# Patient Record
Sex: Female | Born: 1945
Health system: Southern US, Community
[De-identification: ages and names within clinical notes are randomized; demographics above are authoritative.]

## PROBLEM LIST (undated history)

## (undated) DIAGNOSIS — K219 Gastro-esophageal reflux disease without esophagitis: Secondary | ICD-10-CM

## (undated) DIAGNOSIS — T4145XA Adverse effect of unspecified anesthetic, initial encounter: Secondary | ICD-10-CM

## (undated) DIAGNOSIS — G4700 Insomnia, unspecified: Secondary | ICD-10-CM

## (undated) DIAGNOSIS — Z8719 Personal history of other diseases of the digestive system: Secondary | ICD-10-CM

## (undated) DIAGNOSIS — H35372 Puckering of macula, left eye: Secondary | ICD-10-CM

## (undated) DIAGNOSIS — T8859XA Other complications of anesthesia, initial encounter: Secondary | ICD-10-CM

## (undated) DIAGNOSIS — Z87442 Personal history of urinary calculi: Secondary | ICD-10-CM

## (undated) DIAGNOSIS — B029 Zoster without complications: Secondary | ICD-10-CM

## (undated) DIAGNOSIS — G43909 Migraine, unspecified, not intractable, without status migrainosus: Secondary | ICD-10-CM

## (undated) DIAGNOSIS — J309 Allergic rhinitis, unspecified: Secondary | ICD-10-CM

## (undated) DIAGNOSIS — E785 Hyperlipidemia, unspecified: Secondary | ICD-10-CM

## (undated) DIAGNOSIS — J189 Pneumonia, unspecified organism: Secondary | ICD-10-CM

## (undated) DIAGNOSIS — J45909 Unspecified asthma, uncomplicated: Secondary | ICD-10-CM

## (undated) DIAGNOSIS — F4321 Adjustment disorder with depressed mood: Secondary | ICD-10-CM

## (undated) DIAGNOSIS — D6851 Activated protein C resistance: Secondary | ICD-10-CM

## (undated) DIAGNOSIS — E039 Hypothyroidism, unspecified: Secondary | ICD-10-CM

## (undated) DIAGNOSIS — H35342 Macular cyst, hole, or pseudohole, left eye: Secondary | ICD-10-CM

## (undated) HISTORY — PX: CYST EXCISION: SHX5701

## (undated) HISTORY — PX: CATARACT EXTRACTION W/ INTRAOCULAR LENS  IMPLANT, BILATERAL: SHX1307

## (undated) HISTORY — PX: MEDIAL PARTIAL KNEE REPLACEMENT: SHX5965

## (undated) HISTORY — PX: COLONOSCOPY: SHX174

## (undated) HISTORY — PX: VAGINAL HYSTERECTOMY: SUR661

## (undated) HISTORY — DX: Activated protein C resistance: D68.51

## (undated) HISTORY — PX: UPPER GI ENDOSCOPY: SHX6162

## (undated) HISTORY — PX: APPENDECTOMY: SHX54

## (undated) HISTORY — DX: Allergic rhinitis, unspecified: J30.9

## (undated) HISTORY — DX: Insomnia, unspecified: G47.00

## (undated) HISTORY — PX: TUBAL LIGATION: SHX77

## (undated) HISTORY — PX: TRIGGER FINGER RELEASE: SHX641

## (undated) HISTORY — DX: Unspecified asthma, uncomplicated: J45.909

## (undated) HISTORY — DX: Adjustment disorder with depressed mood: F43.21

## (undated) HISTORY — DX: Migraine, unspecified, not intractable, without status migrainosus: G43.909

## (undated) HISTORY — PX: LUMBAR LAMINECTOMY: SHX95

## (undated) HISTORY — DX: Hypothyroidism, unspecified: E03.9

## (undated) HISTORY — DX: Zoster without complications: B02.9

## (undated) HISTORY — DX: Hyperlipidemia, unspecified: E78.5

## (undated) HISTORY — PX: CHOLECYSTECTOMY: SHX55

---

## 1999-06-09 ENCOUNTER — Other Ambulatory Visit: Admission: RE | Admit: 1999-06-09 | Discharge: 1999-06-09 | Payer: Self-pay | Admitting: Obstetrics and Gynecology

## 2000-08-08 ENCOUNTER — Other Ambulatory Visit: Admission: RE | Admit: 2000-08-08 | Discharge: 2000-08-08 | Payer: Self-pay | Admitting: Obstetrics and Gynecology

## 2001-10-18 ENCOUNTER — Other Ambulatory Visit: Admission: RE | Admit: 2001-10-18 | Discharge: 2001-10-18 | Payer: Self-pay | Admitting: Obstetrics and Gynecology

## 2002-10-26 ENCOUNTER — Ambulatory Visit (HOSPITAL_COMMUNITY): Admission: RE | Admit: 2002-10-26 | Discharge: 2002-10-27 | Payer: Self-pay | Admitting: Neurosurgery

## 2002-10-26 ENCOUNTER — Encounter: Payer: Self-pay | Admitting: Neurosurgery

## 2002-12-04 ENCOUNTER — Other Ambulatory Visit: Admission: RE | Admit: 2002-12-04 | Discharge: 2002-12-04 | Payer: Self-pay | Admitting: Obstetrics and Gynecology

## 2004-05-19 ENCOUNTER — Other Ambulatory Visit: Admission: RE | Admit: 2004-05-19 | Discharge: 2004-05-19 | Payer: Self-pay | Admitting: Obstetrics and Gynecology

## 2004-05-28 ENCOUNTER — Encounter: Admission: RE | Admit: 2004-05-28 | Discharge: 2004-05-28 | Payer: Self-pay | Admitting: Obstetrics and Gynecology

## 2005-07-06 ENCOUNTER — Other Ambulatory Visit: Admission: RE | Admit: 2005-07-06 | Discharge: 2005-07-06 | Payer: Self-pay | Admitting: Obstetrics and Gynecology

## 2005-07-08 ENCOUNTER — Encounter: Admission: RE | Admit: 2005-07-08 | Discharge: 2005-07-08 | Payer: Self-pay | Admitting: Obstetrics and Gynecology

## 2006-07-12 ENCOUNTER — Encounter: Admission: RE | Admit: 2006-07-12 | Discharge: 2006-07-12 | Payer: Self-pay | Admitting: Obstetrics and Gynecology

## 2007-10-26 ENCOUNTER — Encounter (INDEPENDENT_AMBULATORY_CARE_PROVIDER_SITE_OTHER): Payer: Self-pay | Admitting: Orthopedic Surgery

## 2007-10-26 ENCOUNTER — Ambulatory Visit (HOSPITAL_BASED_OUTPATIENT_CLINIC_OR_DEPARTMENT_OTHER): Admission: RE | Admit: 2007-10-26 | Discharge: 2007-10-26 | Payer: Self-pay | Admitting: Orthopedic Surgery

## 2008-05-14 ENCOUNTER — Ambulatory Visit (HOSPITAL_BASED_OUTPATIENT_CLINIC_OR_DEPARTMENT_OTHER): Admission: RE | Admit: 2008-05-14 | Discharge: 2008-05-14 | Payer: Self-pay | Admitting: Orthopedic Surgery

## 2008-10-22 ENCOUNTER — Encounter: Admission: RE | Admit: 2008-10-22 | Discharge: 2008-10-22 | Payer: Self-pay | Admitting: Obstetrics and Gynecology

## 2009-11-07 ENCOUNTER — Encounter: Admission: RE | Admit: 2009-11-07 | Discharge: 2009-11-07 | Payer: Self-pay | Admitting: Obstetrics and Gynecology

## 2010-10-19 ENCOUNTER — Other Ambulatory Visit: Payer: Self-pay | Admitting: Obstetrics and Gynecology

## 2010-10-19 DIAGNOSIS — Z1231 Encounter for screening mammogram for malignant neoplasm of breast: Secondary | ICD-10-CM

## 2010-11-09 ENCOUNTER — Ambulatory Visit
Admission: RE | Admit: 2010-11-09 | Discharge: 2010-11-09 | Disposition: A | Discharge status: Home / Self Care | Payer: BC Managed Care – PPO | Source: Ambulatory Visit | Attending: Obstetrics and Gynecology | Admitting: Obstetrics and Gynecology

## 2010-11-09 DIAGNOSIS — Z1231 Encounter for screening mammogram for malignant neoplasm of breast: Secondary | ICD-10-CM

## 2010-11-10 NOTE — Op Note (Signed)
[name], [name]                ACCOUNT NO.:  [id number]   MEDICAL RECORD NO.:  [id number]          PATIENT TYPE:  AMB   LOCATION:  DSC                          FACILITY:  [facility]   PHYSICIAN:  Cindee Salt, M.D.       DATE OF BIRTH:  September 18, 1945   DATE OF PROCEDURE:  10/26/2007  DATE OF DISCHARGE:                               OPERATIVE REPORT   PREOPERATIVE DIAGNOSIS:  Mucoid cyst, left index finger.   POSTOPERATIVE DIAGNOSIS:  Mucoid cyst, left index finger.   OPERATION:  Excision, mucoid cyst; debridement, distal interphalangeal  joint, left index finger.   SURGEON:  Cindee Salt, MD.   ASSISTANT:  Rosalyn Gess. Norins, MD   ANESTHESIA:  Forearm based IV regional.   HISTORY:  The patient is a 65 year old female with a history of a mass  on the dorsal aspect DIP joint of her left index finger.  This does  transilluminate.  X-rays reveal degenerative changes.  She is desirous  having this excised.   Postoperative course had been discussed along with risks and  complication.  She is aware that there is no guarantee with the surgery,  possibility of infection, recurrence, injury to arteries, nerves, or  tendons, incomplete relief of symptoms, and dystrophy.  Preoperative  area, the patient was seen.  The extremity marked by both the patient  and surgeon.  Antibiotic given.  Questions again encouraged and  answered.   PROCEDURE:  The patient was brought to the operating room where a  forearm based IV regional anesthetic was carried out without difficulty.  This was done under the direction of Dr. Krista Blue.  She was prepped using  DuraPrep, supine position, left arm free.  A time-out was performed.  A  curvilinear incision was made over the distal interphalangeal joint,  carried down through subcutaneous tissue.  Bleeders were  electrocauterized.  A mass was encountered on the ulnar aspect of the  distal interphalangeal joint.  This was excised.  A debridement was then  performed of  bone using micro rongeur to both radial and ulnar sites of  the joint.  The extensor tendon was maintained intact.  Osteophytes were  removed.  The specimen was sent to pathology.  The wound was irrigated.  The skin was then closed with interrupted 5-0 Vicryl Rapide sutures.  A  sterile compressive dressing, Alumafoam splint to the finger applied.  The patient tolerated the procedure well and was taken to the recovery  for observation in satisfactory condition.  She will be discharged home  to return to the hand center, [facility] in 1 week on Darvocet-N 100.           ______________________________  Cindee Salt, M.D.     GK/MEDQ  D:  10/26/2007  T:  10/27/2007  Job:  [id number]

## 2010-11-10 NOTE — Op Note (Signed)
[name], [name]                ACCOUNT NO.:  [id number]   MEDICAL RECORD NO.:  [id number]          PATIENT TYPE:  AMB   LOCATION:  DSC                          FACILITY:  [facility]   PHYSICIAN:  Cindee Salt, M.D.       DATE OF BIRTH:  Aug 30, 1945   DATE OF PROCEDURE:  05/14/2008  DATE OF DISCHARGE:                               OPERATIVE REPORT   PREOPERATIVE DIAGNOSIS:  Stenosing tenosynovitis, right thumb.   POSTOPERATIVE DIAGNOSIS:  Stenosing tenosynovitis, right thumb.   OPERATION:  Release A1 pulley, right thumb.   SURGEON:  Cindee Salt, MD   ASSISTANT:  Carolyne Fiscal, RN   ANESTHESIA:  Forearm-based IV regional.   ANESTHESIOLOGIST:  Kaylyn Layer. Michelle Piper, MD   HISTORY:  The patient is a 65 year old female with a history of  triggering of her right thumb.  This has not responded to conservative  treatment.  She has elected to undergo a surgical decompression.  Pre,  peri, and postoperative course have been discussed along with risks and  complications.  She is aware that there is no guarantee with the  surgery; possibility of infection; recurrence; injury to arteries,  nerves, and tendons; incomplete relief of symptoms; and dystrophy.  In  the preoperative area, the patient is seen, the extremity marked by both  the patient and surgeon, antibiotic given.   PROCEDURE:  The patient was brought to the operating room where a  forearm-based IV regional anesthetic was carried out without difficulty  under the direction of Dr. Michelle Piper.  She was prepped using DuraPrep in  supine position, right arm free.  A time-out was taken.  A transverse  incision was made over the A1 pulley of the right thumb, carried down  through subcutaneous tissue.  Neurovascular structures were identified  and protected.  The A1 pulley was released on its radial aspect.  It was  extremely tight with an area of compression in locking of the FPL.  This  was released protecting the FPL tendon.  The oblique pulley was left  intact.  No further lesions were identified.  The wound was irrigated.  The skin closed with interrupted 5-0 Vicryl Rapide sutures.  A sterile  compressive dressing was applied.  The patient tolerated the procedure  well and was taken to the recovery room for observation in satisfactory  condition.  She will be discharged to home to return to the [facility]  [facility] [place] in 1 week, on Talwin NX.           ______________________________  Cindee Salt, M.D.     GK/MEDQ  D:  05/14/2008  T:  05/14/2008  Job:  [id number]

## 2010-11-13 NOTE — Op Note (Signed)
NAMECHENNEL, OLIVOS                          ACCOUNT NO.:  [id number]   MEDICAL RECORD NO.:  [id number]                   PATIENT TYPE:  OIB   LOCATION:  3007                                 FACILITY:  [facility]   PHYSICIAN:  Reinaldo Meeker, M.D.              DATE OF BIRTH:  06-14-1946   DATE OF PROCEDURE:  10/26/2002  DATE OF DISCHARGE:                                 OPERATIVE REPORT   PREOPERATIVE DIAGNOSIS:  Herniated disk, L5-S1, right.   POSTOPERATIVE DIAGNOSIS:  Herniated disk, L5-S1, right.   PROCEDURE:  Right L5-S1 interlaminar laminotomy for excision of herniated  disk with the operating microscope.   SECONDARY PROCEDURE:  Microdissection of L5-S1 disk and S1 nerve root.   SURGEON:  Reinaldo Meeker, M.D.   ASSISTANT:  Donalee Citrin, M.D.   PROCEDURE IN DETAIL:  After being placed in the prone position, the  patient's back was prepped and draped in the usual sterile fashion.  Localizing x-rays were taken prior to incision to identify the appropriate  level.  Midline incision was made over the spinous processes of L5 and S1.  Using the Bovie cutting current, the incision was carried down to the  spinous processes.  Subperiosteal dissection was then carried out along the  right side of the spinous processes of S1, and the McCullough self-retaining  retractor was placed for exposure.  Second x-ray showed approach to the  appropriate level.  Using the high-speed drill, the inferior one third of  the L5 lamina and the medial facet joint of the facet joint were removed.  Drill was then used to remove the superior one third of the S1 lamina.  Residual ligamentum flavum was then removed in a piecemeal fashion.  The  microscope was draped and brought into the field and use for identification.  Upon bring the microscope into the field, prior to incising the disk, a  large amount of disk material was noted in dissecting to open the axilla of  the S1 nerve root, and this disk  material was removed in a piecemeal  fashion.  Microdissection technique was then used to identify the L5-S1 disk  which was also found to be herniated at the nerve root.  After coagulating,  the annulus was incised with a 15 blade.  Using pituitary rongeurs and  clips, thorough disk space clean out was carried out.  At the same time,  great care was take to avoid injury to the neural elements.  This was  successfully done.  At this point, inspection was carried out in all  directions to find evidence of residual compression, and none could be  identified.  Large amounts of irrigation were carried out, and hemostasis  carried out with electrocoagulation and Gelfoam.  It was then closed using  interrupted Vicryl in the muscle, fascia, subcutaneous and subcuticular  tissues, and staples on the skin.  Sterile dressing was then  applied, and  the patient was extubated and taken the recovery room in stable condition.                                               Reinaldo Meeker, M.D.    ROK/MEDQ  D:  10/26/2002  T:  10/26/2002  Job:  [id number]

## 2010-11-13 NOTE — Consult Note (Signed)
[name], LEVETT                          ACCOUNT NO.:  [id number]   MEDICAL RECORD NO.:  [id number]                   PATIENT TYPE:  PE   LOCATION:                                       FACILITY:  [facility]   PHYSICIAN:  Hubbard Hartshorn, M.D. [facility]              DATE OF BIRTH:  1946/03/23   DATE OF CONSULTATION:  01/29/2002  DATE OF DISCHARGE:  10/18/2001                              CARDIOLOGY CONSULTATION   Ms. Ebersole is a 65 year old white female with history of rheumatoid arthritis  on chronic prednisone and gold with history of hiatal hernia and CAD, status  post PTCA of the LAD and circumflex in March of 2003, secondary to MI.  She  presents to the emergency room with abdominal fullness and discomfort  immediately after eating at 4 p.m. today followed by nausea and vomiting  that has been persistent while in the emergency room.  The abdominal pain  has been constant for at least 8 to 10 hours and it was 8 out of 10 in  severity without any radiation or shortness of breath, palpitations, no  syncope or near syncope.  She describes this as markedly different from what  she had with her MI predominant symptoms in March with shortness of breath  and then had some chest pain which was different from this abdominal pain.  When she presented in March she initially had persistent nausea and vomiting  and some abdominal discomfort.  She had negative set of enzymes.  She was  hospitalized for about four or five days and on the day prior to discharge,  she ended up having chest pain and shortness of breath with marked  tachycardia and ruled in for MI and underwent PTCA of an LAD and circumflex  lesion.  She had an EF of about 45 to 50% on echo.  She denies any PND,  orthopnea, or edema.  No diarrhea or constipation.  She has had normal bowel  movements.   PAST MEDICAL HISTORY:  As above plus a hiatal hernia which she refused  surgery for.  Urinary tract infection and hypertension.  Anemia of  chronic  disease.  Her catheterization on September 08, 2001, showed left main 30%, LAD  80%, circumflex and OM1 at 80%, RCA 45 to 50%, and she underwent a stent to  the LAD and circumflex.   MEDICATIONS:  1. Amitriptyline 50 mg q.h.s.  2. Prednisone 5 mg q.d.  3. Nexium 40 mg q.d.  4. Plavix 75 mg p.o. q.d.  5. Aspirin 81 mg p.o. q.d.  6. Toprol XL 100 mg p.o. q.d.  7. Iron sulfate 324 q.d.  8. Questionable gold.   SOCIAL HISTORY:  She is widowed.  She lives with her son.  She denies any  alcohol or tobacco use.   FAMILY HISTORY:  Significant for father had an MI at age 37.  Her mother had  an MI at age 60.   REVIEW OF SYSTEMS:  Positive for nausea, vomiting, and abdominal pain.  Normal bowel movements.  No blood in stool.  Negative for cough, fevers,  diarrhea, or constipation.  Positive for chronic arthritic changes and back  pain.   PHYSICAL EXAMINATION:  VITAL SIGNS:  Blood pressure 138/83, heart rate 115,  respiratory rate 18, and afebrile.  GENERAL:  The patient was sitting up in bed in no acute distress with no JVD  or reflux.  There is positive right carotid bruit.  LUNGS:  No focal consolidation and no presacral edema.  HEART:  Her PMI is nondisplaced with normal S1 and S2, tachycardic, but  regular with S3 or S4 appreciated.  She has a 1-2/6 systolic ejection murmur  at the upper left sternal border.  ABDOMEN:  Decreased bowel sounds but soft.  Positive epigastric tenderness,  but no rebound or guarding.  There are no mass or bruits appreciated.  EXTREMITIES:  No cyanosis, clubbing, or edema with 1 to 2+ distal pulses.  No femoral bruits.  Ulnar deviation and rheumatoid arthritic changes in her  hands.   LABORATORY DATA:  She has an elevation of her white count at 12 with 85%  neutrophils, hematocrit 38, platelets 220.  BUN and creatinine 16 and 0.7  with potassium of 3.2.  Normal LFTs.  Negative amylase.  MB 1.0, CK 33.  EKG  showed sinus tachycardia, right axis  nondiagnostic ST changes diffusely.  Chest x-ray showed no acute infiltrate with a questionable nodule in the  right upper lobe with marked hiatal hernia.   ASSESSMENT:  This is a 65 year old white female with a history of rheumatoid  arthritis on prednisone and gold as well as a large hiatal hernia which she  refuses surgery for, and a recent MI in March of 2003 with status post PTCA  of her LAD and circumflex, who presents with abdominal pain and nausea and  vomiting after eating which was different from her symptoms of her MI in  March which were predominantly shortness of breath and chest pain.  Initial  cardiac enzymes were negative with nondiagnostic ST changes on EKG and an  unremarkable cardiac examination except for sinus tachycardia.  Her symptoms  are more concerning for a GI-related abnormality as in reflux versus  gastritis.  I would recommend checking a second set of cardiac enzymes.  If  this is negative I would consult with internal medicine for further  evaluation.  If if is positive we will admit her to the cardiac intensive  care unit.  I would continue IV hydration and proton pump inhibitor, check  serial enzymes and EKG.  If the GI workup proves negative, then would  consider a stress test prior to discharge.  We will continue consulting with  her.                                               Hubbard Hartshorn, M.D. [facility]    JH/MEDQ  D:  01/29/2002  T:  02/02/2002  Job:  [id number]

## 2011-03-23 LAB — BASIC METABOLIC PANEL
BUN: 6
CO2: 31
Calcium: 9.4
Chloride: 102
Creatinine, Ser: 0.71
GFR calc Af Amer: >60
GFR calc non Af Amer: >60
Glucose, Bld: 89
Potassium: 4.1
Sodium: 139

## 2011-03-23 LAB — POCT HEMOGLOBIN-HEMACUE: Hemoglobin: 12.8

## 2011-03-31 LAB — BASIC METABOLIC PANEL
BUN: 10
CO2: 31
Calcium: 9.2
Chloride: 101
Creatinine, Ser: 0.91
GFR calc Af Amer: >60
GFR calc non Af Amer: >60
Glucose, Bld: 95
Potassium: 3.7
Sodium: 139

## 2011-03-31 LAB — POCT HEMOGLOBIN-HEMACUE: Hemoglobin: 13.1

## 2011-09-14 DIAGNOSIS — M6530 Trigger finger, unspecified finger: Secondary | ICD-10-CM | POA: Diagnosis not present

## 2011-09-28 DIAGNOSIS — J309 Allergic rhinitis, unspecified: Secondary | ICD-10-CM | POA: Diagnosis not present

## 2011-09-30 DIAGNOSIS — J0100 Acute maxillary sinusitis, unspecified: Secondary | ICD-10-CM | POA: Diagnosis not present

## 2011-10-19 DIAGNOSIS — M19049 Primary osteoarthritis, unspecified hand: Secondary | ICD-10-CM | POA: Diagnosis not present

## 2011-11-03 ENCOUNTER — Other Ambulatory Visit: Payer: Self-pay | Admitting: Obstetrics and Gynecology

## 2011-11-03 DIAGNOSIS — Z1231 Encounter for screening mammogram for malignant neoplasm of breast: Secondary | ICD-10-CM

## 2011-11-09 DIAGNOSIS — I781 Nevus, non-neoplastic: Secondary | ICD-10-CM | POA: Diagnosis not present

## 2011-11-09 DIAGNOSIS — K130 Diseases of lips: Secondary | ICD-10-CM | POA: Diagnosis not present

## 2011-11-09 DIAGNOSIS — L578 Other skin changes due to chronic exposure to nonionizing radiation: Secondary | ICD-10-CM | POA: Diagnosis not present

## 2011-11-15 ENCOUNTER — Ambulatory Visit
Admission: RE | Admit: 2011-11-15 | Discharge: 2011-11-15 | Disposition: A | Discharge status: Home / Self Care | Payer: Medicare Other | Source: Ambulatory Visit | Attending: Obstetrics and Gynecology | Admitting: Obstetrics and Gynecology

## 2011-11-15 DIAGNOSIS — Z1231 Encounter for screening mammogram for malignant neoplasm of breast: Secondary | ICD-10-CM

## 2011-11-18 DIAGNOSIS — M19049 Primary osteoarthritis, unspecified hand: Secondary | ICD-10-CM | POA: Diagnosis not present

## 2011-11-21 DIAGNOSIS — J0100 Acute maxillary sinusitis, unspecified: Secondary | ICD-10-CM | POA: Diagnosis not present

## 2012-01-06 DIAGNOSIS — M19049 Primary osteoarthritis, unspecified hand: Secondary | ICD-10-CM | POA: Diagnosis not present

## 2012-01-12 DIAGNOSIS — D2330 Other benign neoplasm of skin of unspecified part of face: Secondary | ICD-10-CM | POA: Diagnosis not present

## 2012-01-12 DIAGNOSIS — I781 Nevus, non-neoplastic: Secondary | ICD-10-CM | POA: Diagnosis not present

## 2012-01-12 DIAGNOSIS — L578 Other skin changes due to chronic exposure to nonionizing radiation: Secondary | ICD-10-CM | POA: Diagnosis not present

## 2012-01-26 DIAGNOSIS — M9981 Other biomechanical lesions of cervical region: Secondary | ICD-10-CM | POA: Diagnosis not present

## 2012-01-26 DIAGNOSIS — S139XXA Sprain of joints and ligaments of unspecified parts of neck, initial encounter: Secondary | ICD-10-CM | POA: Diagnosis not present

## 2012-01-27 DIAGNOSIS — S139XXA Sprain of joints and ligaments of unspecified parts of neck, initial encounter: Secondary | ICD-10-CM | POA: Diagnosis not present

## 2012-01-27 DIAGNOSIS — M9981 Other biomechanical lesions of cervical region: Secondary | ICD-10-CM | POA: Diagnosis not present

## 2012-02-07 DIAGNOSIS — M9981 Other biomechanical lesions of cervical region: Secondary | ICD-10-CM | POA: Diagnosis not present

## 2012-02-07 DIAGNOSIS — S139XXA Sprain of joints and ligaments of unspecified parts of neck, initial encounter: Secondary | ICD-10-CM | POA: Diagnosis not present

## 2012-02-09 DIAGNOSIS — S139XXA Sprain of joints and ligaments of unspecified parts of neck, initial encounter: Secondary | ICD-10-CM | POA: Diagnosis not present

## 2012-02-09 DIAGNOSIS — M9981 Other biomechanical lesions of cervical region: Secondary | ICD-10-CM | POA: Diagnosis not present

## 2012-02-09 DIAGNOSIS — J0100 Acute maxillary sinusitis, unspecified: Secondary | ICD-10-CM | POA: Diagnosis not present

## 2012-02-09 DIAGNOSIS — J309 Allergic rhinitis, unspecified: Secondary | ICD-10-CM | POA: Diagnosis not present

## 2012-02-10 DIAGNOSIS — M19049 Primary osteoarthritis, unspecified hand: Secondary | ICD-10-CM | POA: Diagnosis not present

## 2012-02-10 DIAGNOSIS — S139XXA Sprain of joints and ligaments of unspecified parts of neck, initial encounter: Secondary | ICD-10-CM | POA: Diagnosis not present

## 2012-02-10 DIAGNOSIS — M9981 Other biomechanical lesions of cervical region: Secondary | ICD-10-CM | POA: Diagnosis not present

## 2012-02-14 DIAGNOSIS — M9981 Other biomechanical lesions of cervical region: Secondary | ICD-10-CM | POA: Diagnosis not present

## 2012-02-14 DIAGNOSIS — J209 Acute bronchitis, unspecified: Secondary | ICD-10-CM | POA: Diagnosis not present

## 2012-02-14 DIAGNOSIS — S139XXA Sprain of joints and ligaments of unspecified parts of neck, initial encounter: Secondary | ICD-10-CM | POA: Diagnosis not present

## 2012-02-14 DIAGNOSIS — J069 Acute upper respiratory infection, unspecified: Secondary | ICD-10-CM | POA: Diagnosis not present

## 2012-02-15 DIAGNOSIS — S139XXA Sprain of joints and ligaments of unspecified parts of neck, initial encounter: Secondary | ICD-10-CM | POA: Diagnosis not present

## 2012-02-15 DIAGNOSIS — M9981 Other biomechanical lesions of cervical region: Secondary | ICD-10-CM | POA: Diagnosis not present

## 2012-02-17 DIAGNOSIS — M9981 Other biomechanical lesions of cervical region: Secondary | ICD-10-CM | POA: Diagnosis not present

## 2012-02-17 DIAGNOSIS — S139XXA Sprain of joints and ligaments of unspecified parts of neck, initial encounter: Secondary | ICD-10-CM | POA: Diagnosis not present

## 2012-02-21 DIAGNOSIS — S139XXA Sprain of joints and ligaments of unspecified parts of neck, initial encounter: Secondary | ICD-10-CM | POA: Diagnosis not present

## 2012-02-21 DIAGNOSIS — M9981 Other biomechanical lesions of cervical region: Secondary | ICD-10-CM | POA: Diagnosis not present

## 2012-02-22 DIAGNOSIS — M9981 Other biomechanical lesions of cervical region: Secondary | ICD-10-CM | POA: Diagnosis not present

## 2012-02-22 DIAGNOSIS — S139XXA Sprain of joints and ligaments of unspecified parts of neck, initial encounter: Secondary | ICD-10-CM | POA: Diagnosis not present

## 2012-02-24 DIAGNOSIS — K589 Irritable bowel syndrome without diarrhea: Secondary | ICD-10-CM | POA: Diagnosis not present

## 2012-02-24 DIAGNOSIS — S139XXA Sprain of joints and ligaments of unspecified parts of neck, initial encounter: Secondary | ICD-10-CM | POA: Diagnosis not present

## 2012-02-24 DIAGNOSIS — K219 Gastro-esophageal reflux disease without esophagitis: Secondary | ICD-10-CM | POA: Diagnosis not present

## 2012-02-24 DIAGNOSIS — Z1211 Encounter for screening for malignant neoplasm of colon: Secondary | ICD-10-CM | POA: Diagnosis not present

## 2012-02-24 DIAGNOSIS — M9981 Other biomechanical lesions of cervical region: Secondary | ICD-10-CM | POA: Diagnosis not present

## 2012-03-01 DIAGNOSIS — D128 Benign neoplasm of rectum: Secondary | ICD-10-CM | POA: Diagnosis not present

## 2012-03-01 DIAGNOSIS — D129 Benign neoplasm of anus and anal canal: Secondary | ICD-10-CM | POA: Diagnosis not present

## 2012-03-01 DIAGNOSIS — Z1211 Encounter for screening for malignant neoplasm of colon: Secondary | ICD-10-CM | POA: Diagnosis not present

## 2012-03-01 DIAGNOSIS — K5730 Diverticulosis of large intestine without perforation or abscess without bleeding: Secondary | ICD-10-CM | POA: Diagnosis not present

## 2012-03-01 DIAGNOSIS — R1084 Generalized abdominal pain: Secondary | ICD-10-CM | POA: Diagnosis not present

## 2012-03-01 DIAGNOSIS — K219 Gastro-esophageal reflux disease without esophagitis: Secondary | ICD-10-CM | POA: Diagnosis not present

## 2012-03-02 DIAGNOSIS — S139XXA Sprain of joints and ligaments of unspecified parts of neck, initial encounter: Secondary | ICD-10-CM | POA: Diagnosis not present

## 2012-03-02 DIAGNOSIS — M9981 Other biomechanical lesions of cervical region: Secondary | ICD-10-CM | POA: Diagnosis not present

## 2012-03-03 DIAGNOSIS — J0100 Acute maxillary sinusitis, unspecified: Secondary | ICD-10-CM | POA: Diagnosis not present

## 2012-03-07 DIAGNOSIS — S139XXA Sprain of joints and ligaments of unspecified parts of neck, initial encounter: Secondary | ICD-10-CM | POA: Diagnosis not present

## 2012-03-07 DIAGNOSIS — M9981 Other biomechanical lesions of cervical region: Secondary | ICD-10-CM | POA: Diagnosis not present

## 2012-03-14 DIAGNOSIS — S139XXA Sprain of joints and ligaments of unspecified parts of neck, initial encounter: Secondary | ICD-10-CM | POA: Diagnosis not present

## 2012-03-14 DIAGNOSIS — M9981 Other biomechanical lesions of cervical region: Secondary | ICD-10-CM | POA: Diagnosis not present

## 2012-03-15 DIAGNOSIS — I10 Essential (primary) hypertension: Secondary | ICD-10-CM | POA: Diagnosis not present

## 2012-03-15 DIAGNOSIS — E039 Hypothyroidism, unspecified: Secondary | ICD-10-CM | POA: Diagnosis not present

## 2012-03-15 DIAGNOSIS — M19049 Primary osteoarthritis, unspecified hand: Secondary | ICD-10-CM | POA: Diagnosis not present

## 2012-03-15 DIAGNOSIS — F4321 Adjustment disorder with depressed mood: Secondary | ICD-10-CM | POA: Diagnosis not present

## 2012-03-23 DIAGNOSIS — M9981 Other biomechanical lesions of cervical region: Secondary | ICD-10-CM | POA: Diagnosis not present

## 2012-03-23 DIAGNOSIS — S139XXA Sprain of joints and ligaments of unspecified parts of neck, initial encounter: Secondary | ICD-10-CM | POA: Diagnosis not present

## 2012-03-28 DIAGNOSIS — M9981 Other biomechanical lesions of cervical region: Secondary | ICD-10-CM | POA: Diagnosis not present

## 2012-03-28 DIAGNOSIS — S139XXA Sprain of joints and ligaments of unspecified parts of neck, initial encounter: Secondary | ICD-10-CM | POA: Diagnosis not present

## 2012-03-30 DIAGNOSIS — M9981 Other biomechanical lesions of cervical region: Secondary | ICD-10-CM | POA: Diagnosis not present

## 2012-03-30 DIAGNOSIS — S139XXA Sprain of joints and ligaments of unspecified parts of neck, initial encounter: Secondary | ICD-10-CM | POA: Diagnosis not present

## 2012-04-03 DIAGNOSIS — S139XXA Sprain of joints and ligaments of unspecified parts of neck, initial encounter: Secondary | ICD-10-CM | POA: Diagnosis not present

## 2012-04-03 DIAGNOSIS — M9981 Other biomechanical lesions of cervical region: Secondary | ICD-10-CM | POA: Diagnosis not present

## 2012-04-04 DIAGNOSIS — K589 Irritable bowel syndrome without diarrhea: Secondary | ICD-10-CM | POA: Diagnosis not present

## 2012-04-12 DIAGNOSIS — S139XXA Sprain of joints and ligaments of unspecified parts of neck, initial encounter: Secondary | ICD-10-CM | POA: Diagnosis not present

## 2012-04-12 DIAGNOSIS — M9981 Other biomechanical lesions of cervical region: Secondary | ICD-10-CM | POA: Diagnosis not present

## 2012-04-26 DIAGNOSIS — M9981 Other biomechanical lesions of cervical region: Secondary | ICD-10-CM | POA: Diagnosis not present

## 2012-04-26 DIAGNOSIS — S139XXA Sprain of joints and ligaments of unspecified parts of neck, initial encounter: Secondary | ICD-10-CM | POA: Diagnosis not present

## 2012-05-04 DIAGNOSIS — Z01419 Encounter for gynecological examination (general) (routine) without abnormal findings: Secondary | ICD-10-CM | POA: Diagnosis not present

## 2012-05-12 DIAGNOSIS — M1710 Unilateral primary osteoarthritis, unspecified knee: Secondary | ICD-10-CM | POA: Diagnosis not present

## 2012-06-12 DIAGNOSIS — M1710 Unilateral primary osteoarthritis, unspecified knee: Secondary | ICD-10-CM | POA: Diagnosis not present

## 2012-06-22 DIAGNOSIS — M1710 Unilateral primary osteoarthritis, unspecified knee: Secondary | ICD-10-CM | POA: Diagnosis not present

## 2012-06-27 DIAGNOSIS — J0100 Acute maxillary sinusitis, unspecified: Secondary | ICD-10-CM | POA: Diagnosis not present

## 2012-06-29 DIAGNOSIS — M1710 Unilateral primary osteoarthritis, unspecified knee: Secondary | ICD-10-CM | POA: Diagnosis not present

## 2012-07-05 DIAGNOSIS — Z23 Encounter for immunization: Secondary | ICD-10-CM | POA: Diagnosis not present

## 2012-07-06 DIAGNOSIS — M1710 Unilateral primary osteoarthritis, unspecified knee: Secondary | ICD-10-CM | POA: Diagnosis not present

## 2012-07-14 DIAGNOSIS — M1710 Unilateral primary osteoarthritis, unspecified knee: Secondary | ICD-10-CM | POA: Diagnosis not present

## 2012-08-17 DIAGNOSIS — K219 Gastro-esophageal reflux disease without esophagitis: Secondary | ICD-10-CM | POA: Diagnosis not present

## 2012-10-30 DIAGNOSIS — J069 Acute upper respiratory infection, unspecified: Secondary | ICD-10-CM | POA: Diagnosis not present

## 2012-11-01 DIAGNOSIS — J309 Allergic rhinitis, unspecified: Secondary | ICD-10-CM | POA: Diagnosis not present

## 2012-11-01 DIAGNOSIS — E039 Hypothyroidism, unspecified: Secondary | ICD-10-CM | POA: Diagnosis not present

## 2012-11-01 DIAGNOSIS — J0120 Acute ethmoidal sinusitis, unspecified: Secondary | ICD-10-CM | POA: Diagnosis not present

## 2012-11-24 ENCOUNTER — Other Ambulatory Visit: Payer: Self-pay

## 2012-11-24 DIAGNOSIS — Z1231 Encounter for screening mammogram for malignant neoplasm of breast: Secondary | ICD-10-CM

## 2012-12-25 ENCOUNTER — Ambulatory Visit: Payer: Medicare Other

## 2013-02-21 DIAGNOSIS — M76899 Other specified enthesopathies of unspecified lower limb, excluding foot: Secondary | ICD-10-CM | POA: Diagnosis not present

## 2013-02-21 DIAGNOSIS — M1710 Unilateral primary osteoarthritis, unspecified knee: Secondary | ICD-10-CM | POA: Diagnosis not present

## 2013-04-17 ENCOUNTER — Inpatient Hospital Stay (HOSPITAL_COMMUNITY): Admission: RE | Admit: 2013-04-17 | Payer: Medicare Other | Source: Ambulatory Visit | Admitting: Orthopedic Surgery

## 2013-04-17 ENCOUNTER — Encounter (HOSPITAL_COMMUNITY): Admission: RE | Payer: Self-pay | Source: Ambulatory Visit

## 2013-04-17 SURGERY — ARTHROPLASTY, KNEE, TOTAL
Anesthesia: Spinal | Site: Knee | Laterality: Left

## 2013-07-02 ENCOUNTER — Ambulatory Visit
Admission: RE | Admit: 2013-07-02 | Discharge: 2013-07-02 | Disposition: A | Discharge status: Home / Self Care | Payer: Medicare Other | Source: Ambulatory Visit

## 2013-07-02 DIAGNOSIS — Z1231 Encounter for screening mammogram for malignant neoplasm of breast: Secondary | ICD-10-CM

## 2013-08-22 DIAGNOSIS — E039 Hypothyroidism, unspecified: Secondary | ICD-10-CM | POA: Diagnosis not present

## 2013-08-22 DIAGNOSIS — F4321 Adjustment disorder with depressed mood: Secondary | ICD-10-CM | POA: Diagnosis not present

## 2013-08-22 DIAGNOSIS — E782 Mixed hyperlipidemia: Secondary | ICD-10-CM | POA: Diagnosis not present

## 2013-08-22 DIAGNOSIS — J0120 Acute ethmoidal sinusitis, unspecified: Secondary | ICD-10-CM | POA: Diagnosis not present

## 2013-08-22 DIAGNOSIS — Z79899 Other long term (current) drug therapy: Secondary | ICD-10-CM | POA: Diagnosis not present

## 2013-08-22 DIAGNOSIS — I10 Essential (primary) hypertension: Secondary | ICD-10-CM | POA: Diagnosis not present

## 2013-11-15 DIAGNOSIS — J0100 Acute maxillary sinusitis, unspecified: Secondary | ICD-10-CM | POA: Diagnosis not present

## 2013-11-21 DIAGNOSIS — M1610 Unilateral primary osteoarthritis, unspecified hip: Secondary | ICD-10-CM | POA: Diagnosis not present

## 2013-11-21 DIAGNOSIS — M1710 Unilateral primary osteoarthritis, unspecified knee: Secondary | ICD-10-CM | POA: Diagnosis not present

## 2013-12-06 DIAGNOSIS — H35379 Puckering of macula, unspecified eye: Secondary | ICD-10-CM | POA: Diagnosis not present

## 2013-12-06 DIAGNOSIS — H40059 Ocular hypertension, unspecified eye: Secondary | ICD-10-CM | POA: Diagnosis not present

## 2013-12-06 DIAGNOSIS — H04129 Dry eye syndrome of unspecified lacrimal gland: Secondary | ICD-10-CM | POA: Diagnosis not present

## 2013-12-11 DIAGNOSIS — N760 Acute vaginitis: Secondary | ICD-10-CM | POA: Diagnosis not present

## 2013-12-11 DIAGNOSIS — Z124 Encounter for screening for malignant neoplasm of cervix: Secondary | ICD-10-CM | POA: Diagnosis not present

## 2013-12-13 DIAGNOSIS — J309 Allergic rhinitis, unspecified: Secondary | ICD-10-CM | POA: Diagnosis not present

## 2013-12-13 DIAGNOSIS — E039 Hypothyroidism, unspecified: Secondary | ICD-10-CM | POA: Diagnosis not present

## 2013-12-13 DIAGNOSIS — I10 Essential (primary) hypertension: Secondary | ICD-10-CM | POA: Diagnosis not present

## 2013-12-13 DIAGNOSIS — K219 Gastro-esophageal reflux disease without esophagitis: Secondary | ICD-10-CM | POA: Diagnosis not present

## 2013-12-13 DIAGNOSIS — Z01818 Encounter for other preprocedural examination: Secondary | ICD-10-CM | POA: Diagnosis not present

## 2013-12-13 DIAGNOSIS — E782 Mixed hyperlipidemia: Secondary | ICD-10-CM | POA: Diagnosis not present

## 2013-12-13 DIAGNOSIS — Z01810 Encounter for preprocedural cardiovascular examination: Secondary | ICD-10-CM | POA: Diagnosis not present

## 2013-12-13 DIAGNOSIS — F4321 Adjustment disorder with depressed mood: Secondary | ICD-10-CM | POA: Diagnosis not present

## 2013-12-13 DIAGNOSIS — G4700 Insomnia, unspecified: Secondary | ICD-10-CM | POA: Diagnosis not present

## 2013-12-20 DIAGNOSIS — G8918 Other acute postprocedural pain: Secondary | ICD-10-CM | POA: Diagnosis not present

## 2013-12-20 DIAGNOSIS — M1710 Unilateral primary osteoarthritis, unspecified knee: Secondary | ICD-10-CM | POA: Diagnosis not present

## 2013-12-20 DIAGNOSIS — IMO0002 Reserved for concepts with insufficient information to code with codable children: Secondary | ICD-10-CM | POA: Diagnosis not present

## 2013-12-22 DIAGNOSIS — IMO0001 Reserved for inherently not codable concepts without codable children: Secondary | ICD-10-CM | POA: Diagnosis not present

## 2013-12-22 DIAGNOSIS — Z96659 Presence of unspecified artificial knee joint: Secondary | ICD-10-CM | POA: Diagnosis not present

## 2013-12-22 DIAGNOSIS — Z471 Aftercare following joint replacement surgery: Secondary | ICD-10-CM | POA: Diagnosis not present

## 2013-12-22 DIAGNOSIS — Z9181 History of falling: Secondary | ICD-10-CM | POA: Diagnosis not present

## 2013-12-24 DIAGNOSIS — Z9181 History of falling: Secondary | ICD-10-CM | POA: Diagnosis not present

## 2013-12-24 DIAGNOSIS — Z96659 Presence of unspecified artificial knee joint: Secondary | ICD-10-CM | POA: Diagnosis not present

## 2013-12-24 DIAGNOSIS — Z471 Aftercare following joint replacement surgery: Secondary | ICD-10-CM | POA: Diagnosis not present

## 2013-12-24 DIAGNOSIS — IMO0001 Reserved for inherently not codable concepts without codable children: Secondary | ICD-10-CM | POA: Diagnosis not present

## 2013-12-25 DIAGNOSIS — Z96659 Presence of unspecified artificial knee joint: Secondary | ICD-10-CM | POA: Diagnosis not present

## 2013-12-25 DIAGNOSIS — IMO0001 Reserved for inherently not codable concepts without codable children: Secondary | ICD-10-CM | POA: Diagnosis not present

## 2013-12-25 DIAGNOSIS — Z9181 History of falling: Secondary | ICD-10-CM | POA: Diagnosis not present

## 2013-12-25 DIAGNOSIS — Z471 Aftercare following joint replacement surgery: Secondary | ICD-10-CM | POA: Diagnosis not present

## 2013-12-26 DIAGNOSIS — IMO0001 Reserved for inherently not codable concepts without codable children: Secondary | ICD-10-CM | POA: Diagnosis not present

## 2013-12-26 DIAGNOSIS — Z96659 Presence of unspecified artificial knee joint: Secondary | ICD-10-CM | POA: Diagnosis not present

## 2013-12-26 DIAGNOSIS — Z9181 History of falling: Secondary | ICD-10-CM | POA: Diagnosis not present

## 2013-12-26 DIAGNOSIS — Z471 Aftercare following joint replacement surgery: Secondary | ICD-10-CM | POA: Diagnosis not present

## 2013-12-27 DIAGNOSIS — Z96659 Presence of unspecified artificial knee joint: Secondary | ICD-10-CM | POA: Diagnosis not present

## 2013-12-27 DIAGNOSIS — IMO0001 Reserved for inherently not codable concepts without codable children: Secondary | ICD-10-CM | POA: Diagnosis not present

## 2013-12-27 DIAGNOSIS — Z9181 History of falling: Secondary | ICD-10-CM | POA: Diagnosis not present

## 2013-12-27 DIAGNOSIS — Z471 Aftercare following joint replacement surgery: Secondary | ICD-10-CM | POA: Diagnosis not present

## 2013-12-28 DIAGNOSIS — Z471 Aftercare following joint replacement surgery: Secondary | ICD-10-CM | POA: Diagnosis not present

## 2013-12-28 DIAGNOSIS — IMO0001 Reserved for inherently not codable concepts without codable children: Secondary | ICD-10-CM | POA: Diagnosis not present

## 2013-12-28 DIAGNOSIS — Z9181 History of falling: Secondary | ICD-10-CM | POA: Diagnosis not present

## 2013-12-28 DIAGNOSIS — Z96659 Presence of unspecified artificial knee joint: Secondary | ICD-10-CM | POA: Diagnosis not present

## 2013-12-31 DIAGNOSIS — Z471 Aftercare following joint replacement surgery: Secondary | ICD-10-CM | POA: Diagnosis not present

## 2013-12-31 DIAGNOSIS — IMO0001 Reserved for inherently not codable concepts without codable children: Secondary | ICD-10-CM | POA: Diagnosis not present

## 2013-12-31 DIAGNOSIS — Z9181 History of falling: Secondary | ICD-10-CM | POA: Diagnosis not present

## 2013-12-31 DIAGNOSIS — Z96659 Presence of unspecified artificial knee joint: Secondary | ICD-10-CM | POA: Diagnosis not present

## 2014-01-01 DIAGNOSIS — Z9181 History of falling: Secondary | ICD-10-CM | POA: Diagnosis not present

## 2014-01-01 DIAGNOSIS — Z471 Aftercare following joint replacement surgery: Secondary | ICD-10-CM | POA: Diagnosis not present

## 2014-01-01 DIAGNOSIS — IMO0001 Reserved for inherently not codable concepts without codable children: Secondary | ICD-10-CM | POA: Diagnosis not present

## 2014-01-01 DIAGNOSIS — Z96659 Presence of unspecified artificial knee joint: Secondary | ICD-10-CM | POA: Diagnosis not present

## 2014-01-02 DIAGNOSIS — Z471 Aftercare following joint replacement surgery: Secondary | ICD-10-CM | POA: Diagnosis not present

## 2014-01-02 DIAGNOSIS — Z96659 Presence of unspecified artificial knee joint: Secondary | ICD-10-CM | POA: Diagnosis not present

## 2014-01-02 DIAGNOSIS — Z9181 History of falling: Secondary | ICD-10-CM | POA: Diagnosis not present

## 2014-01-02 DIAGNOSIS — IMO0001 Reserved for inherently not codable concepts without codable children: Secondary | ICD-10-CM | POA: Diagnosis not present

## 2014-01-03 DIAGNOSIS — Z96659 Presence of unspecified artificial knee joint: Secondary | ICD-10-CM | POA: Diagnosis not present

## 2014-01-03 DIAGNOSIS — IMO0001 Reserved for inherently not codable concepts without codable children: Secondary | ICD-10-CM | POA: Diagnosis not present

## 2014-01-03 DIAGNOSIS — Z471 Aftercare following joint replacement surgery: Secondary | ICD-10-CM | POA: Diagnosis not present

## 2014-01-03 DIAGNOSIS — Z9181 History of falling: Secondary | ICD-10-CM | POA: Diagnosis not present

## 2014-01-04 DIAGNOSIS — IMO0001 Reserved for inherently not codable concepts without codable children: Secondary | ICD-10-CM | POA: Diagnosis not present

## 2014-01-04 DIAGNOSIS — Z9181 History of falling: Secondary | ICD-10-CM | POA: Diagnosis not present

## 2014-01-04 DIAGNOSIS — Z471 Aftercare following joint replacement surgery: Secondary | ICD-10-CM | POA: Diagnosis not present

## 2014-01-04 DIAGNOSIS — Z96659 Presence of unspecified artificial knee joint: Secondary | ICD-10-CM | POA: Diagnosis not present

## 2014-01-09 DIAGNOSIS — M79609 Pain in unspecified limb: Secondary | ICD-10-CM | POA: Diagnosis not present

## 2014-01-11 DIAGNOSIS — M79609 Pain in unspecified limb: Secondary | ICD-10-CM | POA: Diagnosis not present

## 2014-01-14 DIAGNOSIS — M79609 Pain in unspecified limb: Secondary | ICD-10-CM | POA: Diagnosis not present

## 2014-01-15 DIAGNOSIS — M25559 Pain in unspecified hip: Secondary | ICD-10-CM | POA: Diagnosis not present

## 2014-01-15 DIAGNOSIS — Z96659 Presence of unspecified artificial knee joint: Secondary | ICD-10-CM | POA: Diagnosis not present

## 2014-01-16 DIAGNOSIS — M79609 Pain in unspecified limb: Secondary | ICD-10-CM | POA: Diagnosis not present

## 2014-01-18 DIAGNOSIS — M79609 Pain in unspecified limb: Secondary | ICD-10-CM | POA: Diagnosis not present

## 2014-01-21 DIAGNOSIS — M79609 Pain in unspecified limb: Secondary | ICD-10-CM | POA: Diagnosis not present

## 2014-01-21 DIAGNOSIS — M549 Dorsalgia, unspecified: Secondary | ICD-10-CM | POA: Diagnosis not present

## 2014-01-22 DIAGNOSIS — M549 Dorsalgia, unspecified: Secondary | ICD-10-CM | POA: Diagnosis not present

## 2014-01-23 ENCOUNTER — Other Ambulatory Visit: Payer: Self-pay | Admitting: Neurosurgery

## 2014-01-23 DIAGNOSIS — M47817 Spondylosis without myelopathy or radiculopathy, lumbosacral region: Secondary | ICD-10-CM | POA: Diagnosis not present

## 2014-01-23 DIAGNOSIS — G8929 Other chronic pain: Secondary | ICD-10-CM

## 2014-01-23 DIAGNOSIS — Z6833 Body mass index (BMI) 33.0-33.9, adult: Secondary | ICD-10-CM | POA: Diagnosis not present

## 2014-01-23 DIAGNOSIS — M545 Low back pain: Principal | ICD-10-CM

## 2014-01-23 DIAGNOSIS — M48061 Spinal stenosis, lumbar region without neurogenic claudication: Secondary | ICD-10-CM | POA: Diagnosis not present

## 2014-01-23 DIAGNOSIS — M549 Dorsalgia, unspecified: Secondary | ICD-10-CM | POA: Diagnosis not present

## 2014-01-24 ENCOUNTER — Ambulatory Visit
Admission: RE | Admit: 2014-01-24 | Discharge: 2014-01-24 | Disposition: A | Discharge status: Home / Self Care | Payer: Medicare Other | Source: Ambulatory Visit | Attending: Neurosurgery | Admitting: Neurosurgery

## 2014-01-24 DIAGNOSIS — M545 Low back pain, unspecified: Secondary | ICD-10-CM | POA: Diagnosis not present

## 2014-01-24 DIAGNOSIS — G8929 Other chronic pain: Secondary | ICD-10-CM

## 2014-01-24 MED ORDER — IOHEXOL 180 MG/ML  SOLN
1.0000 mL | Freq: Once | INTRAMUSCULAR | Status: AC | PRN
  Administered 2014-01-24: 1 mL via EPIDURAL

## 2014-01-24 MED ORDER — METHYLPREDNISOLONE ACETATE 40 MG/ML INJ SUSP (RADIOLOG
120.0000 mg | Freq: Once | INTRAMUSCULAR | Status: AC
  Administered 2014-01-24: 120 mg via EPIDURAL

## 2014-01-24 NOTE — Discharge Instructions (Signed)

## 2014-01-25 DIAGNOSIS — M79609 Pain in unspecified limb: Secondary | ICD-10-CM | POA: Diagnosis not present

## 2014-02-04 DIAGNOSIS — M79609 Pain in unspecified limb: Secondary | ICD-10-CM | POA: Diagnosis not present

## 2014-02-08 DIAGNOSIS — M79609 Pain in unspecified limb: Secondary | ICD-10-CM | POA: Diagnosis not present

## 2014-02-12 DIAGNOSIS — M79609 Pain in unspecified limb: Secondary | ICD-10-CM | POA: Diagnosis not present

## 2014-02-14 ENCOUNTER — Other Ambulatory Visit: Payer: Self-pay | Admitting: Neurosurgery

## 2014-02-14 DIAGNOSIS — M48061 Spinal stenosis, lumbar region without neurogenic claudication: Secondary | ICD-10-CM

## 2014-02-15 DIAGNOSIS — M79609 Pain in unspecified limb: Secondary | ICD-10-CM | POA: Diagnosis not present

## 2014-02-18 ENCOUNTER — Ambulatory Visit
Admission: RE | Admit: 2014-02-18 | Discharge: 2014-02-18 | Disposition: A | Discharge status: Home / Self Care | Payer: Medicare Other | Source: Ambulatory Visit | Attending: Neurosurgery | Admitting: Neurosurgery

## 2014-02-18 VITALS — BP 107/49 | HR 75

## 2014-02-18 DIAGNOSIS — M48061 Spinal stenosis, lumbar region without neurogenic claudication: Secondary | ICD-10-CM

## 2014-02-18 DIAGNOSIS — M545 Low back pain, unspecified: Secondary | ICD-10-CM | POA: Diagnosis not present

## 2014-02-18 MED ORDER — IOHEXOL 180 MG/ML  SOLN
1.0000 mL | Freq: Once | INTRAMUSCULAR | Status: AC | PRN
  Administered 2014-02-18: 1 mL via EPIDURAL

## 2014-02-18 MED ORDER — METHYLPREDNISOLONE ACETATE 40 MG/ML INJ SUSP (RADIOLOG
120.0000 mg | Freq: Once | INTRAMUSCULAR | Status: AC
  Administered 2014-02-18: 120 mg via EPIDURAL

## 2014-02-18 MED ORDER — LACTATED RINGERS IV SOLN
Freq: Once | INTRAVENOUS | Status: AC
  Administered 2014-02-18: 11:00:00 via INTRAVENOUS

## 2014-02-18 NOTE — Progress Notes (Addendum)
Mr. Wexler at bedside. 11:04 pt's BP is still only in the 80's will have Dr. Jobe Igo come see the pt. 1112 Dr. Jobe Igo into visit, ordered IV due to low BP.

## 2014-02-18 NOTE — Progress Notes (Signed)
Dr. Jobe Igo in to see pt and she is ready for discharge.

## 2014-02-18 NOTE — Progress Notes (Signed)
Dr. Jobe Igo in to visit and speak to pt who had a syncopal episode during epidural steroid injection. Pt is awake and very pale, but is feeling better.

## 2014-02-19 DIAGNOSIS — R197 Diarrhea, unspecified: Secondary | ICD-10-CM | POA: Diagnosis not present

## 2014-03-20 DIAGNOSIS — Z96659 Presence of unspecified artificial knee joint: Secondary | ICD-10-CM | POA: Diagnosis not present

## 2014-04-04 DIAGNOSIS — J040 Acute laryngitis: Secondary | ICD-10-CM | POA: Diagnosis not present

## 2014-04-04 DIAGNOSIS — J209 Acute bronchitis, unspecified: Secondary | ICD-10-CM | POA: Diagnosis not present

## 2014-04-04 DIAGNOSIS — J0100 Acute maxillary sinusitis, unspecified: Secondary | ICD-10-CM | POA: Diagnosis not present

## 2014-04-09 DIAGNOSIS — Z0000 Encounter for general adult medical examination without abnormal findings: Secondary | ICD-10-CM | POA: Diagnosis not present

## 2014-04-09 DIAGNOSIS — J209 Acute bronchitis, unspecified: Secondary | ICD-10-CM | POA: Diagnosis not present

## 2014-04-16 DIAGNOSIS — J189 Pneumonia, unspecified organism: Secondary | ICD-10-CM | POA: Diagnosis not present

## 2014-04-19 DIAGNOSIS — Z09 Encounter for follow-up examination after completed treatment for conditions other than malignant neoplasm: Secondary | ICD-10-CM | POA: Diagnosis not present

## 2014-04-19 DIAGNOSIS — Z8701 Personal history of pneumonia (recurrent): Secondary | ICD-10-CM | POA: Diagnosis not present

## 2014-04-19 DIAGNOSIS — J189 Pneumonia, unspecified organism: Secondary | ICD-10-CM | POA: Diagnosis not present

## 2014-04-29 DIAGNOSIS — Z23 Encounter for immunization: Secondary | ICD-10-CM | POA: Diagnosis not present

## 2014-04-29 DIAGNOSIS — J00 Acute nasopharyngitis [common cold]: Secondary | ICD-10-CM | POA: Diagnosis not present

## 2014-04-29 DIAGNOSIS — R070 Pain in throat: Secondary | ICD-10-CM | POA: Diagnosis not present

## 2014-05-08 DIAGNOSIS — J0110 Acute frontal sinusitis, unspecified: Secondary | ICD-10-CM | POA: Diagnosis not present

## 2014-05-08 DIAGNOSIS — J0100 Acute maxillary sinusitis, unspecified: Secondary | ICD-10-CM | POA: Diagnosis not present

## 2014-05-13 DIAGNOSIS — E780 Pure hypercholesterolemia: Secondary | ICD-10-CM | POA: Diagnosis not present

## 2014-05-13 DIAGNOSIS — R42 Dizziness and giddiness: Secondary | ICD-10-CM | POA: Diagnosis not present

## 2014-05-13 DIAGNOSIS — J45909 Unspecified asthma, uncomplicated: Secondary | ICD-10-CM | POA: Diagnosis not present

## 2014-05-13 DIAGNOSIS — R05 Cough: Secondary | ICD-10-CM | POA: Diagnosis not present

## 2014-05-13 DIAGNOSIS — I517 Cardiomegaly: Secondary | ICD-10-CM | POA: Diagnosis not present

## 2014-05-13 DIAGNOSIS — Z7952 Long term (current) use of systemic steroids: Secondary | ICD-10-CM | POA: Diagnosis not present

## 2014-05-13 DIAGNOSIS — R55 Syncope and collapse: Secondary | ICD-10-CM | POA: Diagnosis not present

## 2014-05-13 DIAGNOSIS — R0602 Shortness of breath: Secondary | ICD-10-CM | POA: Diagnosis not present

## 2014-05-13 DIAGNOSIS — J189 Pneumonia, unspecified organism: Secondary | ICD-10-CM | POA: Diagnosis not present

## 2014-05-13 DIAGNOSIS — J40 Bronchitis, not specified as acute or chronic: Secondary | ICD-10-CM | POA: Diagnosis not present

## 2014-05-13 DIAGNOSIS — R0989 Other specified symptoms and signs involving the circulatory and respiratory systems: Secondary | ICD-10-CM | POA: Diagnosis not present

## 2014-05-13 DIAGNOSIS — R112 Nausea with vomiting, unspecified: Secondary | ICD-10-CM | POA: Diagnosis not present

## 2014-05-13 DIAGNOSIS — Z792 Long term (current) use of antibiotics: Secondary | ICD-10-CM | POA: Diagnosis not present

## 2014-06-17 DIAGNOSIS — J0101 Acute recurrent maxillary sinusitis: Secondary | ICD-10-CM | POA: Diagnosis not present

## 2014-06-17 DIAGNOSIS — J40 Bronchitis, not specified as acute or chronic: Secondary | ICD-10-CM | POA: Diagnosis not present

## 2014-07-02 ENCOUNTER — Other Ambulatory Visit: Payer: Self-pay | Admitting: Obstetrics and Gynecology

## 2014-07-02 DIAGNOSIS — Z779 Other contact with and (suspected) exposures hazardous to health: Secondary | ICD-10-CM | POA: Diagnosis not present

## 2014-07-02 DIAGNOSIS — Z124 Encounter for screening for malignant neoplasm of cervix: Secondary | ICD-10-CM | POA: Diagnosis not present

## 2014-07-03 DIAGNOSIS — F4321 Adjustment disorder with depressed mood: Secondary | ICD-10-CM | POA: Diagnosis not present

## 2014-07-03 DIAGNOSIS — I10 Essential (primary) hypertension: Secondary | ICD-10-CM | POA: Diagnosis not present

## 2014-07-03 DIAGNOSIS — E039 Hypothyroidism, unspecified: Secondary | ICD-10-CM | POA: Diagnosis not present

## 2014-07-03 DIAGNOSIS — E782 Mixed hyperlipidemia: Secondary | ICD-10-CM | POA: Diagnosis not present

## 2014-07-03 LAB — CYTOLOGY - PAP

## 2014-07-10 DIAGNOSIS — E782 Mixed hyperlipidemia: Secondary | ICD-10-CM | POA: Diagnosis not present

## 2014-07-10 DIAGNOSIS — Z79899 Other long term (current) drug therapy: Secondary | ICD-10-CM | POA: Diagnosis not present

## 2014-07-25 DIAGNOSIS — J309 Allergic rhinitis, unspecified: Secondary | ICD-10-CM | POA: Diagnosis not present

## 2014-07-25 DIAGNOSIS — J370 Chronic laryngitis: Secondary | ICD-10-CM | POA: Diagnosis not present

## 2014-07-25 DIAGNOSIS — J4540 Moderate persistent asthma, uncomplicated: Secondary | ICD-10-CM | POA: Diagnosis not present

## 2014-08-11 DIAGNOSIS — J0100 Acute maxillary sinusitis, unspecified: Secondary | ICD-10-CM | POA: Diagnosis not present

## 2014-08-14 DIAGNOSIS — L570 Actinic keratosis: Secondary | ICD-10-CM | POA: Diagnosis not present

## 2014-08-14 DIAGNOSIS — C44319 Basal cell carcinoma of skin of other parts of face: Secondary | ICD-10-CM | POA: Diagnosis not present

## 2014-08-14 DIAGNOSIS — L821 Other seborrheic keratosis: Secondary | ICD-10-CM | POA: Diagnosis not present

## 2014-08-14 DIAGNOSIS — L820 Inflamed seborrheic keratosis: Secondary | ICD-10-CM | POA: Diagnosis not present

## 2014-08-14 DIAGNOSIS — L578 Other skin changes due to chronic exposure to nonionizing radiation: Secondary | ICD-10-CM | POA: Diagnosis not present

## 2014-08-22 DIAGNOSIS — J4540 Moderate persistent asthma, uncomplicated: Secondary | ICD-10-CM | POA: Diagnosis not present

## 2014-08-22 DIAGNOSIS — J309 Allergic rhinitis, unspecified: Secondary | ICD-10-CM | POA: Diagnosis not present

## 2014-08-30 ENCOUNTER — Other Ambulatory Visit: Payer: Self-pay | Admitting: Neurosurgery

## 2014-08-30 DIAGNOSIS — M48061 Spinal stenosis, lumbar region without neurogenic claudication: Secondary | ICD-10-CM

## 2014-09-02 ENCOUNTER — Ambulatory Visit
Admission: RE | Admit: 2014-09-02 | Discharge: 2014-09-02 | Disposition: A | Discharge status: Home / Self Care | Payer: Medicare Other | Source: Ambulatory Visit | Attending: Neurosurgery | Admitting: Neurosurgery

## 2014-09-02 DIAGNOSIS — M48061 Spinal stenosis, lumbar region without neurogenic claudication: Secondary | ICD-10-CM

## 2014-09-02 DIAGNOSIS — M545 Low back pain: Secondary | ICD-10-CM | POA: Diagnosis not present

## 2014-09-02 MED ORDER — METHYLPREDNISOLONE ACETATE 40 MG/ML INJ SUSP (RADIOLOG
120.0000 mg | Freq: Once | INTRAMUSCULAR | Status: AC
  Administered 2014-09-02: 120 mg via EPIDURAL

## 2014-09-02 MED ORDER — IOHEXOL 180 MG/ML  SOLN
1.0000 mL | Freq: Once | INTRAMUSCULAR | Status: AC | PRN
  Administered 2014-09-02: 1 mL via EPIDURAL

## 2014-09-02 NOTE — Discharge Instructions (Signed)

## 2014-09-05 DIAGNOSIS — J329 Chronic sinusitis, unspecified: Secondary | ICD-10-CM | POA: Diagnosis not present

## 2014-09-05 DIAGNOSIS — J3489 Other specified disorders of nose and nasal sinuses: Secondary | ICD-10-CM | POA: Diagnosis not present

## 2014-09-05 DIAGNOSIS — R05 Cough: Secondary | ICD-10-CM | POA: Diagnosis not present

## 2014-09-12 DIAGNOSIS — L570 Actinic keratosis: Secondary | ICD-10-CM | POA: Diagnosis not present

## 2014-10-10 DIAGNOSIS — J309 Allergic rhinitis, unspecified: Secondary | ICD-10-CM | POA: Diagnosis not present

## 2014-10-10 DIAGNOSIS — J4540 Moderate persistent asthma, uncomplicated: Secondary | ICD-10-CM | POA: Diagnosis not present

## 2014-10-12 DIAGNOSIS — J4521 Mild intermittent asthma with (acute) exacerbation: Secondary | ICD-10-CM | POA: Diagnosis not present

## 2014-10-12 DIAGNOSIS — J0100 Acute maxillary sinusitis, unspecified: Secondary | ICD-10-CM | POA: Diagnosis not present

## 2014-10-30 DIAGNOSIS — J209 Acute bronchitis, unspecified: Secondary | ICD-10-CM | POA: Diagnosis not present

## 2014-11-27 DIAGNOSIS — H43813 Vitreous degeneration, bilateral: Secondary | ICD-10-CM | POA: Diagnosis not present

## 2014-11-27 DIAGNOSIS — H40053 Ocular hypertension, bilateral: Secondary | ICD-10-CM | POA: Diagnosis not present

## 2014-11-27 DIAGNOSIS — H04123 Dry eye syndrome of bilateral lacrimal glands: Secondary | ICD-10-CM | POA: Diagnosis not present

## 2014-11-27 DIAGNOSIS — H35373 Puckering of macula, bilateral: Secondary | ICD-10-CM | POA: Diagnosis not present

## 2014-12-04 ENCOUNTER — Encounter (INDEPENDENT_AMBULATORY_CARE_PROVIDER_SITE_OTHER): Payer: Medicare Other | Admitting: Ophthalmology

## 2014-12-04 DIAGNOSIS — H35033 Hypertensive retinopathy, bilateral: Secondary | ICD-10-CM | POA: Diagnosis not present

## 2014-12-04 DIAGNOSIS — H35373 Puckering of macula, bilateral: Secondary | ICD-10-CM

## 2014-12-04 DIAGNOSIS — H2513 Age-related nuclear cataract, bilateral: Secondary | ICD-10-CM

## 2014-12-04 DIAGNOSIS — I10 Essential (primary) hypertension: Secondary | ICD-10-CM

## 2014-12-04 DIAGNOSIS — H43813 Vitreous degeneration, bilateral: Secondary | ICD-10-CM

## 2015-01-27 DIAGNOSIS — R0602 Shortness of breath: Secondary | ICD-10-CM | POA: Diagnosis not present

## 2015-01-27 DIAGNOSIS — R601 Generalized edema: Secondary | ICD-10-CM | POA: Diagnosis not present

## 2015-01-27 DIAGNOSIS — J209 Acute bronchitis, unspecified: Secondary | ICD-10-CM | POA: Diagnosis not present

## 2015-01-27 DIAGNOSIS — R0789 Other chest pain: Secondary | ICD-10-CM | POA: Diagnosis not present

## 2015-01-27 DIAGNOSIS — R079 Chest pain, unspecified: Secondary | ICD-10-CM | POA: Diagnosis not present

## 2015-02-04 DIAGNOSIS — J45909 Unspecified asthma, uncomplicated: Secondary | ICD-10-CM | POA: Diagnosis not present

## 2015-02-04 DIAGNOSIS — J209 Acute bronchitis, unspecified: Secondary | ICD-10-CM | POA: Diagnosis not present

## 2015-02-24 DIAGNOSIS — J209 Acute bronchitis, unspecified: Secondary | ICD-10-CM | POA: Diagnosis not present

## 2015-02-24 DIAGNOSIS — R062 Wheezing: Secondary | ICD-10-CM | POA: Diagnosis not present

## 2015-03-10 DIAGNOSIS — J45909 Unspecified asthma, uncomplicated: Secondary | ICD-10-CM | POA: Insufficient documentation

## 2015-03-10 DIAGNOSIS — J309 Allergic rhinitis, unspecified: Secondary | ICD-10-CM | POA: Insufficient documentation

## 2015-03-10 DIAGNOSIS — K219 Gastro-esophageal reflux disease without esophagitis: Secondary | ICD-10-CM | POA: Insufficient documentation

## 2015-03-10 HISTORY — DX: Gastro-esophageal reflux disease without esophagitis: K21.9

## 2015-04-16 ENCOUNTER — Other Ambulatory Visit: Payer: Self-pay | Admitting: *Deleted

## 2015-04-16 DIAGNOSIS — H16223 Keratoconjunctivitis sicca, not specified as Sjogren's, bilateral: Secondary | ICD-10-CM | POA: Diagnosis not present

## 2015-04-16 DIAGNOSIS — H35373 Puckering of macula, bilateral: Secondary | ICD-10-CM | POA: Diagnosis not present

## 2015-04-16 DIAGNOSIS — H2513 Age-related nuclear cataract, bilateral: Secondary | ICD-10-CM | POA: Diagnosis not present

## 2015-04-16 MED ORDER — RANITIDINE HCL 300 MG PO CAPS: 300.0000 mg | ORAL_CAPSULE | Freq: Every evening | ORAL | Status: DC

## 2015-04-30 DIAGNOSIS — Z779 Other contact with and (suspected) exposures hazardous to health: Secondary | ICD-10-CM | POA: Diagnosis not present

## 2015-04-30 DIAGNOSIS — Z124 Encounter for screening for malignant neoplasm of cervix: Secondary | ICD-10-CM | POA: Diagnosis not present

## 2015-04-30 DIAGNOSIS — Z6832 Body mass index (BMI) 32.0-32.9, adult: Secondary | ICD-10-CM | POA: Diagnosis not present

## 2015-05-05 DIAGNOSIS — M9902 Segmental and somatic dysfunction of thoracic region: Secondary | ICD-10-CM | POA: Diagnosis not present

## 2015-05-05 DIAGNOSIS — M546 Pain in thoracic spine: Secondary | ICD-10-CM | POA: Diagnosis not present

## 2015-05-05 DIAGNOSIS — M542 Cervicalgia: Secondary | ICD-10-CM | POA: Diagnosis not present

## 2015-05-05 DIAGNOSIS — M9901 Segmental and somatic dysfunction of cervical region: Secondary | ICD-10-CM | POA: Diagnosis not present

## 2015-05-05 DIAGNOSIS — S134XXA Sprain of ligaments of cervical spine, initial encounter: Secondary | ICD-10-CM | POA: Diagnosis not present

## 2015-05-06 DIAGNOSIS — M546 Pain in thoracic spine: Secondary | ICD-10-CM | POA: Diagnosis not present

## 2015-05-06 DIAGNOSIS — S134XXA Sprain of ligaments of cervical spine, initial encounter: Secondary | ICD-10-CM | POA: Diagnosis not present

## 2015-05-06 DIAGNOSIS — M9902 Segmental and somatic dysfunction of thoracic region: Secondary | ICD-10-CM | POA: Diagnosis not present

## 2015-05-06 DIAGNOSIS — M9901 Segmental and somatic dysfunction of cervical region: Secondary | ICD-10-CM | POA: Diagnosis not present

## 2015-05-06 DIAGNOSIS — M542 Cervicalgia: Secondary | ICD-10-CM | POA: Diagnosis not present

## 2015-05-08 DIAGNOSIS — M50223 Other cervical disc displacement at C6-C7 level: Secondary | ICD-10-CM | POA: Diagnosis not present

## 2015-05-08 DIAGNOSIS — M546 Pain in thoracic spine: Secondary | ICD-10-CM | POA: Diagnosis not present

## 2015-05-08 DIAGNOSIS — M5136 Other intervertebral disc degeneration, lumbar region: Secondary | ICD-10-CM | POA: Diagnosis not present

## 2015-05-08 DIAGNOSIS — M9903 Segmental and somatic dysfunction of lumbar region: Secondary | ICD-10-CM | POA: Diagnosis not present

## 2015-05-08 DIAGNOSIS — M5413 Radiculopathy, cervicothoracic region: Secondary | ICD-10-CM | POA: Diagnosis not present

## 2015-05-08 DIAGNOSIS — M9901 Segmental and somatic dysfunction of cervical region: Secondary | ICD-10-CM | POA: Diagnosis not present

## 2015-05-08 DIAGNOSIS — M9902 Segmental and somatic dysfunction of thoracic region: Secondary | ICD-10-CM | POA: Diagnosis not present

## 2015-05-08 DIAGNOSIS — M545 Low back pain: Secondary | ICD-10-CM | POA: Diagnosis not present

## 2015-05-12 DIAGNOSIS — M9902 Segmental and somatic dysfunction of thoracic region: Secondary | ICD-10-CM | POA: Diagnosis not present

## 2015-05-12 DIAGNOSIS — M9903 Segmental and somatic dysfunction of lumbar region: Secondary | ICD-10-CM | POA: Diagnosis not present

## 2015-05-12 DIAGNOSIS — M546 Pain in thoracic spine: Secondary | ICD-10-CM | POA: Diagnosis not present

## 2015-05-12 DIAGNOSIS — M9901 Segmental and somatic dysfunction of cervical region: Secondary | ICD-10-CM | POA: Diagnosis not present

## 2015-05-12 DIAGNOSIS — M5413 Radiculopathy, cervicothoracic region: Secondary | ICD-10-CM | POA: Diagnosis not present

## 2015-05-12 DIAGNOSIS — M545 Low back pain: Secondary | ICD-10-CM | POA: Diagnosis not present

## 2015-05-12 DIAGNOSIS — M5136 Other intervertebral disc degeneration, lumbar region: Secondary | ICD-10-CM | POA: Diagnosis not present

## 2015-05-12 DIAGNOSIS — M50223 Other cervical disc displacement at C6-C7 level: Secondary | ICD-10-CM | POA: Diagnosis not present

## 2015-05-14 DIAGNOSIS — H16223 Keratoconjunctivitis sicca, not specified as Sjogren's, bilateral: Secondary | ICD-10-CM | POA: Diagnosis not present

## 2015-05-14 DIAGNOSIS — H2513 Age-related nuclear cataract, bilateral: Secondary | ICD-10-CM | POA: Diagnosis not present

## 2015-05-14 DIAGNOSIS — H40053 Ocular hypertension, bilateral: Secondary | ICD-10-CM | POA: Diagnosis not present

## 2015-05-15 DIAGNOSIS — M5136 Other intervertebral disc degeneration, lumbar region: Secondary | ICD-10-CM | POA: Diagnosis not present

## 2015-05-15 DIAGNOSIS — M50223 Other cervical disc displacement at C6-C7 level: Secondary | ICD-10-CM | POA: Diagnosis not present

## 2015-05-15 DIAGNOSIS — M5413 Radiculopathy, cervicothoracic region: Secondary | ICD-10-CM | POA: Diagnosis not present

## 2015-05-15 DIAGNOSIS — M546 Pain in thoracic spine: Secondary | ICD-10-CM | POA: Diagnosis not present

## 2015-05-15 DIAGNOSIS — M9903 Segmental and somatic dysfunction of lumbar region: Secondary | ICD-10-CM | POA: Diagnosis not present

## 2015-05-15 DIAGNOSIS — M545 Low back pain: Secondary | ICD-10-CM | POA: Diagnosis not present

## 2015-05-15 DIAGNOSIS — M9901 Segmental and somatic dysfunction of cervical region: Secondary | ICD-10-CM | POA: Diagnosis not present

## 2015-05-15 DIAGNOSIS — M9902 Segmental and somatic dysfunction of thoracic region: Secondary | ICD-10-CM | POA: Diagnosis not present

## 2015-05-21 DIAGNOSIS — M9902 Segmental and somatic dysfunction of thoracic region: Secondary | ICD-10-CM | POA: Diagnosis not present

## 2015-05-21 DIAGNOSIS — M5413 Radiculopathy, cervicothoracic region: Secondary | ICD-10-CM | POA: Diagnosis not present

## 2015-05-21 DIAGNOSIS — M50223 Other cervical disc displacement at C6-C7 level: Secondary | ICD-10-CM | POA: Diagnosis not present

## 2015-05-21 DIAGNOSIS — M9903 Segmental and somatic dysfunction of lumbar region: Secondary | ICD-10-CM | POA: Diagnosis not present

## 2015-05-21 DIAGNOSIS — M545 Low back pain: Secondary | ICD-10-CM | POA: Diagnosis not present

## 2015-05-21 DIAGNOSIS — M9901 Segmental and somatic dysfunction of cervical region: Secondary | ICD-10-CM | POA: Diagnosis not present

## 2015-05-21 DIAGNOSIS — M5136 Other intervertebral disc degeneration, lumbar region: Secondary | ICD-10-CM | POA: Diagnosis not present

## 2015-05-21 DIAGNOSIS — M546 Pain in thoracic spine: Secondary | ICD-10-CM | POA: Diagnosis not present

## 2015-05-27 DIAGNOSIS — M542 Cervicalgia: Secondary | ICD-10-CM | POA: Diagnosis not present

## 2015-05-27 DIAGNOSIS — M50322 Other cervical disc degeneration at C5-C6 level: Secondary | ICD-10-CM | POA: Diagnosis not present

## 2015-06-02 DIAGNOSIS — J0100 Acute maxillary sinusitis, unspecified: Secondary | ICD-10-CM | POA: Diagnosis not present

## 2015-07-17 ENCOUNTER — Other Ambulatory Visit: Payer: Self-pay | Admitting: Allergy and Immunology

## 2015-07-23 ENCOUNTER — Other Ambulatory Visit: Payer: Self-pay | Admitting: Allergy and Immunology

## 2015-09-15 DIAGNOSIS — L821 Other seborrheic keratosis: Secondary | ICD-10-CM | POA: Diagnosis not present

## 2015-09-15 DIAGNOSIS — L578 Other skin changes due to chronic exposure to nonionizing radiation: Secondary | ICD-10-CM | POA: Diagnosis not present

## 2015-09-15 DIAGNOSIS — L820 Inflamed seborrheic keratosis: Secondary | ICD-10-CM | POA: Diagnosis not present

## 2015-09-15 DIAGNOSIS — L570 Actinic keratosis: Secondary | ICD-10-CM | POA: Diagnosis not present

## 2015-09-20 DIAGNOSIS — J069 Acute upper respiratory infection, unspecified: Secondary | ICD-10-CM | POA: Diagnosis not present

## 2015-09-24 DIAGNOSIS — R062 Wheezing: Secondary | ICD-10-CM | POA: Diagnosis not present

## 2015-09-24 DIAGNOSIS — R05 Cough: Secondary | ICD-10-CM | POA: Diagnosis not present

## 2015-09-24 DIAGNOSIS — R0600 Dyspnea, unspecified: Secondary | ICD-10-CM | POA: Diagnosis not present

## 2015-09-30 DIAGNOSIS — Z0000 Encounter for general adult medical examination without abnormal findings: Secondary | ICD-10-CM | POA: Diagnosis not present

## 2015-09-30 DIAGNOSIS — E039 Hypothyroidism, unspecified: Secondary | ICD-10-CM | POA: Diagnosis not present

## 2015-09-30 DIAGNOSIS — J4540 Moderate persistent asthma, uncomplicated: Secondary | ICD-10-CM | POA: Diagnosis not present

## 2015-09-30 DIAGNOSIS — E782 Mixed hyperlipidemia: Secondary | ICD-10-CM | POA: Diagnosis not present

## 2015-09-30 DIAGNOSIS — I10 Essential (primary) hypertension: Secondary | ICD-10-CM | POA: Diagnosis not present

## 2015-09-30 DIAGNOSIS — Z79899 Other long term (current) drug therapy: Secondary | ICD-10-CM | POA: Diagnosis not present

## 2015-10-09 DIAGNOSIS — J4540 Moderate persistent asthma, uncomplicated: Secondary | ICD-10-CM | POA: Diagnosis not present

## 2015-10-09 DIAGNOSIS — I10 Essential (primary) hypertension: Secondary | ICD-10-CM | POA: Diagnosis not present

## 2015-10-09 DIAGNOSIS — J309 Allergic rhinitis, unspecified: Secondary | ICD-10-CM | POA: Diagnosis not present

## 2015-10-13 ENCOUNTER — Other Ambulatory Visit: Payer: Self-pay | Admitting: Allergy and Immunology

## 2015-10-24 ENCOUNTER — Other Ambulatory Visit: Payer: Self-pay | Admitting: Neurosurgery

## 2015-10-24 DIAGNOSIS — M48061 Spinal stenosis, lumbar region without neurogenic claudication: Secondary | ICD-10-CM

## 2015-10-27 ENCOUNTER — Ambulatory Visit
Admission: RE | Admit: 2015-10-27 | Discharge: 2015-10-27 | Disposition: A | Discharge status: Home / Self Care | Payer: Medicare Other | Source: Ambulatory Visit | Attending: Neurosurgery | Admitting: Neurosurgery

## 2015-10-27 DIAGNOSIS — M48061 Spinal stenosis, lumbar region without neurogenic claudication: Secondary | ICD-10-CM

## 2015-10-27 DIAGNOSIS — M47817 Spondylosis without myelopathy or radiculopathy, lumbosacral region: Secondary | ICD-10-CM | POA: Diagnosis not present

## 2015-10-27 MED ORDER — METHYLPREDNISOLONE ACETATE 40 MG/ML INJ SUSP (RADIOLOG
120.0000 mg | Freq: Once | INTRAMUSCULAR | Status: AC
  Administered 2015-10-27: 120 mg via EPIDURAL

## 2015-10-27 MED ORDER — IOPAMIDOL (ISOVUE-M 200) INJECTION 41%
1.0000 mL | Freq: Once | INTRAMUSCULAR | Status: AC
  Administered 2015-10-27: 1 mL via EPIDURAL

## 2015-10-27 NOTE — Discharge Instructions (Signed)

## 2015-10-28 ENCOUNTER — Ambulatory Visit: Payer: Medicare Other | Admitting: Internal Medicine

## 2015-10-28 ENCOUNTER — Encounter: Payer: Self-pay | Admitting: *Deleted

## 2015-10-28 ENCOUNTER — Encounter: Payer: Self-pay | Admitting: Internal Medicine

## 2015-10-28 VITALS — BP 150/84 | HR 101 | Ht 61.5 in | Wt 167.0 lb

## 2015-10-28 DIAGNOSIS — R059 Cough, unspecified: Secondary | ICD-10-CM

## 2015-10-28 DIAGNOSIS — R05 Cough: Secondary | ICD-10-CM

## 2015-10-28 DIAGNOSIS — R0600 Dyspnea, unspecified: Secondary | ICD-10-CM

## 2015-10-28 HISTORY — DX: Cough, unspecified: R05.9

## 2015-10-28 LAB — NITRIC OXIDE: Nitric Oxide: 28

## 2015-10-28 MED ORDER — TRAMADOL HCL 50 MG PO TABS: ORAL_TABLET | ORAL | Status: DC

## 2015-10-28 NOTE — Progress Notes (Signed)
Subjective:    Patient ID: [name], female    DOB: September 07, 1945, 70 y.o.   MRN: [id number]  HPI  27 yowf never smoker with onset of rhinitis and coughing in 80s eval by ? Gerilyn Nestle on shots x years / re-eval by [employer] in [place] and intially continued shots, retested when noted change in symptoms with more sob but "less allergies" and confirmed skin tests neg  and dx as reflux on maint qvar/singulair/stopped shots and not much change  off the shots then bad pna fall 2015 and never better since = chronic cough so referred to pulmonary clinic 10/28/2015 by Dr Chesley Mires.   10/28/2015 1st [facility] office visit/ Michaell Grider  maint on asthmanex/singulair/ Chief Complaint  Patient presents with  . Pulmonary Consult    Self Referral for Asthma  bothered by a new cough since the pna fall 2015 improves while on abx (best one levaquin) but never completely  resolves esp urge to clear thorat  Doe every time exerts more than just room to room waling   Cough worse in am but does not tend to wake her first thing in am p zantac 300 mg each hs.  No obvious other patterns in day to day or daytime variabilty or assoc true excess/ purulent sputum or mucus plugs   or cp or chest tightness, subjective wheeze overt sinus or hb symptoms. No unusual exp hx or h/o childhood pna/ asthma or knowledge of premature birth.  Sleeping ok without nocturnal  or early am exacerbation  of respiratory  c/o's or need for noct saba. Also denies any obvious fluctuation of symptoms with weather or environmental changes or other aggravating or alleviating factors except as outlined above   Current Medications, Allergies, Complete Past Medical History, Past Surgical History, Family History, and Social History were reviewed in [employer] record.            Review of Systems  Constitutional: Negative for fever and unexpected weight change.  HENT: Positive for congestion, ear pain, sneezing, sore throat and  trouble swallowing. Negative for dental problem, nosebleeds, postnasal drip, rhinorrhea and sinus pressure.   Eyes: Negative for redness and itching.  Respiratory: Positive for cough and shortness of breath. Negative for chest tightness and wheezing.   Cardiovascular: Negative for palpitations and leg swelling.  Gastrointestinal: Positive for abdominal pain. Negative for nausea and vomiting.  Genitourinary: Negative for dysuria.  Musculoskeletal: Negative for joint swelling.  Skin: Negative for rash.  Neurological: Positive for headaches.  Hematological: Does not bruise/bleed easily.  Psychiatric/Behavioral: Negative for dysphoric mood. The patient is not nervous/anxious.        Objective:   Physical Exam  amb obese wf nad  Wt Readings from Last 3 Encounters:  10/28/15 167 lb (75.751 kg)    Vital signs reviewed   HEENT: nl dentition, turbinates, and oropharynx. Nl external ear canals without cough reflex   NECK :  without JVD/Nodes/TM/ nl carotid upstrokes bilaterally   LUNGS: no acc muscle use,  Nl contour chest which is clear to A and P bilaterally without cough on insp or exp maneuvers   CV:  RRR  no s3 or murmur or increase in P2, no edema   ABD:  soft and nontender with nl inspiratory excursion in the supine position. No bruits or organomegaly, bowel sounds nl  MS:  Nl gait/ ext warm without deformities, calf tenderness, cyanosis or clubbing No obvious joint restrictions   SKIN: warm and dry without lesions  NEURO:  alert, approp, nl sensorium with  no motor deficits          Assessment & Plan:

## 2015-10-28 NOTE — Patient Instructions (Addendum)
Stop asthmanex for now  Prevacid    Take  30-60 min before Take 30- 60 min before your first and last meals of the day  and Zantac 300 mg one @  bedtime until return to office - this is the best way to tell whether stomach acid is contributing to your problem.    GERD (REFLUX)  is an extremely common cause of respiratory symptoms just like yours , many times with no obvious heartburn at all.    It can be treated with medication, but also with lifestyle changes including elevation of the head of your bed (ideally with 6 inch  bed blocks),  Smoking cessation, avoidance of late meals, excessive alcohol, and avoid fatty foods, chocolate, peppermint, colas, red wine, and acidic juices such as orange juice.  NO MINT OR MENTHOL PRODUCTS SO NO COUGH DROPS  USE SUGARLESS CANDY INSTEAD (Jolley ranchers or Stover's or Life Savers) or even ice chips will also do - the key is to swallow to prevent all throat clearing. NO OIL BASED VITAMINS - use powdered substitutes.   For drainage / throat tickle try take CHLORPHENIRAMINE  4 mg - take one every 4 hours as needed - available over the counter- may cause drowsiness so start with just a bedtime dose or two and see how you tolerate it before trying in daytime     If still can't stop throat clearing Take delsym two tsp every 12 hours and supplement if needed with  tramadol 50 mg up to 2 every 4 hours to suppress the urge to cough. Swallowing water or using ice chips/non mint and menthol containing candies (such as lifesavers or sugarless jolly ranchers) are also effective.  You should rest your voice and avoid activities that you know make you cough.  Once you have eliminated the cough for 3 straight days try reducing the tramadol first,  then the delsym as tolerated.    Please schedule a follow up office visit in 2 weeks, sooner if needed

## 2015-10-29 DIAGNOSIS — R0600 Dyspnea, unspecified: Secondary | ICD-10-CM

## 2015-10-29 HISTORY — DX: Dyspnea, unspecified: R06.00

## 2015-10-29 NOTE — Assessment & Plan Note (Addendum)
NO  10/29/2015      = 29  - Spirometry 10/28/2015  wnl including fef 25-75    The most common causes of chronic cough in immunocompetent adults include the following: upper airway cough syndrome (UACS), previously referred to as postnasal drip syndrome (PNDS), which is caused by variety of rhinosinus conditions; (2) asthma; (3) GERD; (4) chronic bronchitis from cigarette smoking or other inhaled environmental irritants; (5) nonasthmatic eosinophilic bronchitis; and (6) bronchiectasis.   These conditions, singly or in combination, have accounted for up to 94% of the causes of chronic cough in prospective studies.   Other conditions have constituted no >6% of the causes in prospective studies These have included bronchogenic carcinoma, chronic interstitial pneumonia, sarcoidosis, left ventricular failure, ACEI-induced cough, and aspiration from a condition associated with pharyngeal dysfunction.    Chronic cough is often simultaneously caused by more than one condition. A single cause has been found from 38 to 82% of the time, multiple causes from 18 to 62%. Multiply caused cough has been the result of three diseases up to 42% of the time.       Based on hx and exam, this is most likely not cough variant asthma but     Upper airway cough syndrome, so named because it's frequently impossible to sort out how much is  CR/sinusitis with freq throat clearing (which can be related to primary GERD)   vs  causing  secondary (" extra esophageal")  GERD from wide swings in gastric pressure that occur with throat clearing, often  promoting self use of mint and menthol lozenges that reduce the lower esophageal sphincter tone and exacerbate the problem further in a cyclical fashion.   These are the same pts (now being labeled as having "irritable larynx syndrome" by some cough centers) who not infrequently have a history of having failed to tolerate ace inhibitors,  dry powder inhalers(and even hfa ICS in some cases and  may be the case here)  or biphosphonates or report having atypical reflux symptoms that don't respond to standard doses of PPI , and are easily confused as having aecopd or asthma flares by even experienced allergists/ pulmonologists.   The first step is to maximize GERD rx/diet/ add 1st gen H1 per guidelines  and eliminate ICS and  cyclical coughing then regroup in 2 weeks.  I had an extended discussion with the patient reviewing all relevant studies completed to date and  lasting 35  minutes of a60 minute initial office evaluation including review of records from [place] the three most common causes of chronic cough, only one (GERD)  can actually cause the other two (asthma and post nasal drip syndrome)  and perpetuate the cylce of cough inducing airway trauma, inflammation, heightened sensitivity to reflux which is prompted by the cough itself via a cyclical mechanism.    This may partially respond to steroids and look like asthma and post nasal drainage but never erradicated completely unless the cough and the secondary reflux are eliminated, preferably both at the same time.  While not intuitively obvious, many patients with chronic low grade reflux do not cough until there is a secondary insult that disturbs the protective epithelial barrier and exposes sensitive nerve endings.  This can be viral or direct physical injury such as with an endotracheal tube. The point is that once this occurs, it is difficult to eliminate using anything but a maximally effective acid suppression regimen at least in the short run, accompanied by  an appropriate diet to address non acid GERD.      The problem is that we don't have effective meds for reflux(just the acid component) and coughing makes reflux worse, so we need to eliminate all throat clearing/ coughing to eliminate all reflux  Each maintenance medication was reviewed in detail including most importantly the difference between maintenance  and as needed and under what circumstances the prns are to be used.  Please see instructions for details which were reviewed in writing and the patient given a copy.

## 2015-10-29 NOTE — Assessment & Plan Note (Addendum)
10/28/2015  Walked RA x 3 laps @ 185 ft each stopped due to  End of study, nl pace, no sob or desat    - spirometry 10/28/2015 also wnl   Symptoms are markedly disproportionate to objective findings and not clear this is a lung problem but pt does appear to have difficult airway management issues. DDX of  difficult airways management almost all start with A and  include Adherence, Ace Inhibitors, Acid Reflux, Active Sinus Disease, Alpha 1 Antitripsin deficiency, Anxiety masquerading as Airways dz,  ABPA,  Allergy(esp in young), Aspiration (esp in elderly), Adverse effects of meds,  Active smokers, A bunch of PE's (a small clot burden can't cause this syndrome unless there is already severe underlying pulm or vascular dz with poor reserve) plus two Bs  = Bronchiectasis and Beta blocker use..and one C= CHF   Adherence is always the initial "prime suspect" and is a multilayered concern that requires a "trust but verify" approach in every patient - starting with knowing how to use medications, especially inhalers, correctly, keeping up with refills and understanding the fundamental difference between maintenance and prns vs those medications only taken for a very short course and then stopped and not refilled.   Acid reflux >  See cough   ? Allergy/asthma > neg skin testing per Dr Kozlow/ ok to continue on singulair and prn saba   ? Anxiety > usually at the bottom of this list of usual suspects but should be much higher on this pt's based on H and P and note already on psychotropics .   ? Active sinus dz >note resp to abx > Sinus CT next step esp  if cough persists   ? Bronchiectasis > suggested also by improvement on abx > HRCT next with sinus ct

## 2015-10-30 ENCOUNTER — Telehealth: Payer: Self-pay | Admitting: Internal Medicine

## 2015-10-30 DIAGNOSIS — R05 Cough: Secondary | ICD-10-CM

## 2015-10-30 DIAGNOSIS — R059 Cough, unspecified: Secondary | ICD-10-CM

## 2015-10-30 NOTE — Telephone Encounter (Signed)
Per MW- the last cxr from [facility] was reviewed, and is not helpful regarding her present work up (it was normal) If not improving needs to have HRCT and sinus ct prior to next ov

## 2015-11-11 ENCOUNTER — Ambulatory Visit: Payer: Medicare Other | Admitting: Internal Medicine

## 2015-11-13 NOTE — Telephone Encounter (Signed)
LVM for pt to return call

## 2015-11-13 NOTE — Telephone Encounter (Signed)
yes

## 2015-11-13 NOTE — Telephone Encounter (Signed)
Patient called returning our call -prm  °

## 2015-11-13 NOTE — Telephone Encounter (Signed)
Pt states the cough is better but still has a lot of hoarseness.  Still taking Prevacid bid and Zantac at HS.  Uses Delsym prn.  Was unable to Take Chlorphenaramine due to causing opposite effect of diff sleeping and also Tramadol made her feel "loopy".  Do you still want to proceed with HRCT and sinus ct prior to f/u appt with you?

## 2015-11-14 NOTE — Telephone Encounter (Signed)
Pt is aware that we are placing orders for HRCT and CT Sinus - these will need to be done before her appt on 11/19/15.  Pt requests that we call her back on her work number to let her know of the appts -- ([phone]  Orders placed for both HRCT and Sinus CT - will send to [facility] to schedule this for Monday if possible. Thanks.

## 2015-11-14 NOTE — Telephone Encounter (Signed)
I have scheduled pt for Monday 5/22 at 4:00. I have given pt appt info. Nothing further needed.

## 2015-11-17 ENCOUNTER — Ambulatory Visit (INDEPENDENT_AMBULATORY_CARE_PROVIDER_SITE_OTHER)
Admission: RE | Admit: 2015-11-17 | Discharge: 2015-11-17 | Disposition: A | Discharge status: Home / Self Care | Payer: Medicare Other | Source: Ambulatory Visit | Attending: Internal Medicine | Admitting: Internal Medicine

## 2015-11-17 DIAGNOSIS — R059 Cough, unspecified: Secondary | ICD-10-CM

## 2015-11-17 DIAGNOSIS — J3489 Other specified disorders of nose and nasal sinuses: Secondary | ICD-10-CM | POA: Diagnosis not present

## 2015-11-17 DIAGNOSIS — R05 Cough: Secondary | ICD-10-CM

## 2015-11-19 ENCOUNTER — Encounter: Payer: Self-pay | Admitting: Internal Medicine

## 2015-11-19 ENCOUNTER — Ambulatory Visit (INDEPENDENT_AMBULATORY_CARE_PROVIDER_SITE_OTHER): Payer: Medicare Other | Admitting: Internal Medicine

## 2015-11-19 VITALS — BP 134/84 | HR 83 | Ht 60.05 in | Wt 164.0 lb

## 2015-11-19 DIAGNOSIS — R05 Cough: Secondary | ICD-10-CM

## 2015-11-19 DIAGNOSIS — R059 Cough, unspecified: Secondary | ICD-10-CM

## 2015-11-19 MED ORDER — GABAPENTIN 100 MG PO CAPS: 100.0000 mg | ORAL_CAPSULE | Freq: Three times a day (TID) | ORAL | Status: DC

## 2015-11-19 NOTE — Progress Notes (Signed)
Subjective:    Patient ID: [name], female    DOB: [birth year]    MRN: [id number]    Brief patient profile:  40 yowf never smoker with onset of rhinitis and coughing in 80s eval by ? Gerilyn Nestle on shots x years / re-eval by [employer] in [place] and intially continued shots, retested when noted change in symptoms with more sob but "less allergies" and confirmed skin tests neg  and dx as reflux on maint qvar/singulair/stopped shots and not much change  off the shots then bad pna fall 2015 and never better since = chronic cough so referred to pulmonary clinic 10/28/2015 by Dr Chesley Mires.   History of Present Illness  10/28/2015 1st [facility] office visit/ Wert  maint on asthmanex/singulair/ Chief Complaint  Patient presents with  . Pulmonary Consult    Self Referral for Asthma  bothered by a new cough since the pna fall 2015 improves while on abx (best one levaquin) but never completely  resolves esp urge to clear thorat rec Stop asthmanex for now Prevacid    Take  30-60 min before Take 30- 60 min before your first and last meals of the day  and Zantac 300 mg one @  bedtime until return to office - this is the best way to tell whether stomach acid is contributing to your problem.   GERD diet  For drainage / throat tickle try take CHLORPHENIRAMINE  4 mg - take one every 4 hours as needed - available over the counter- may cause drowsiness so start with just a bedtime dose or two and see how you tolerate it before trying in daytime   If still can't stop throat clearing Take delsym two tsp every 12 hours and supplement if needed with  tramadol 50 mg up to 2 every 4     11/19/2015  f/u ov/Wert re:  Chief Complaint  Patient presents with  . Follow-up    Breathing has improved. She c/o minimal hoarseness x 3 days and has "throat congestion". She has not needed albuterol inhaler or neb.    did not take h1 or tramadol as rec  Has sense of day but not noct throat congestion > no excess/ purulent  sputum or mucus plugs    No obvious day to day or daytime variability or assoc sob  or cp or chest tightness, subjective wheeze or overt sinus or hb symptoms. No unusual exp hx or h/o childhood pna/ asthma or knowledge of premature birth.  Sleeping ok without nocturnal  or early am exacerbation  of respiratory  c/o's or need for noct saba. Also denies any obvious fluctuation of symptoms with weather or environmental changes or other aggravating or alleviating factors except as outlined above   Current Medications, Allergies, Complete Past Medical History, Past Surgical History, Family History, and Social History were reviewed in [employer] record.  ROS  The following are not active complaints unless bolded sore throat, dysphagia, dental problems, itching, sneezing,  nasal congestion or excess/ purulent secretions, ear ache,   fever, chills, sweats, unintended wt loss, classically pleuritic or exertional cp, hemoptysis,  orthopnea pnd or leg swelling, presyncope, palpitations, abdominal pain, anorexia, nausea, vomiting, diarrhea  or change in bowel or bladder habits, change in stools or urine, dysuria,hematuria,  rash, arthralgias, visual complaints, headache, numbness, weakness or ataxia or problems with walking or coordination,  change in mood/affect or memory.  Objective:   Physical Exam  amb obese wf nad still with occ throat clearing  Wt Readings from Last 3 Encounters:  11/19/15 164 lb (74.39 kg)  10/28/15 167 lb (75.751 kg)    Vital signs reviewed    HEENT: nl dentition, turbinates, and oropharynx which is pristine. Nl external ear canals without cough reflex   NECK :  without JVD/Nodes/TM/ nl carotid upstrokes bilaterally   LUNGS: no acc muscle use,  Nl contour chest which is clear to A and P bilaterally without cough on insp or exp maneuvers   CV:  RRR  no s3 or murmur or increase in P2, no edema   ABD:  soft and nontender  with nl inspiratory excursion in the supine position. No bruits or organomegaly, bowel sounds nl  MS:  Nl gait/ ext warm without deformities, calf tenderness, cyanosis or clubbing No obvious joint restrictions   SKIN: warm and dry without lesions    NEURO:  alert, approp, nl sensorium with  no motor deficits          Assessment & Plan:

## 2015-11-19 NOTE — Patient Instructions (Signed)
For drainage / throat tickle try take CHLORPHENIRAMINE  4 mg - take one every 4 hours as needed - available over the counter- may cause drowsiness so start with just a bedtime dose or two and see how you tolerate it before trying in daytime    If not better after week > gabapentin 100 mg three x daily with meals > if not better after 2 weeks then call for referral to [facility]  If better return 6 week

## 2015-11-20 NOTE — Assessment & Plan Note (Signed)
NO  10/29/2015      = 28  - Spirometry 10/28/2015  wnl including fef 25-75   - Trial off asthmanex 10/28/2015 > breathing improved/ only throat clearing remained 11/19/2015  - Sinus Ct 11/17/2015 > Paranasal sinuses are clear. - CT chest 11/17/2015 > No evidence of interstitial lung disease. No findings to explain the patient's cough - rechallenge 11/19/2015 with 1st gen h1  > then gabapentin 100 tid     Hx since first eval here is much stronger indication of uacs > asthma   Classic Upper airway cough syndrome, so named because it's frequently impossible to sort out how much is  CR/sinusitis with freq throat clearing (which can be related to primary GERD)   vs  causing  secondary (" extra esophageal")  GERD from wide swings in gastric pressure that occur with throat clearing, often  promoting self use of mint and menthol lozenges that reduce the lower esophageal sphincter tone and exacerbate the problem further in a cyclical fashion.   These are the same pts (now being labeled as having "irritable larynx syndrome" by some cough centers) who not infrequently have a history of having failed to tolerate ace inhibitors,  dry powder inhalers or biphosphonates or report having atypical reflux symptoms that don't respond to standard doses of PPI , and are easily confused as having aecopd or asthma flares by even experienced allergists/ pulmonologists.    rec max rx for pnds per guidelines then add trial of gabapentin 100 tid for irritable larynx  I had an extended discussion with the patient reviewing all relevant studies completed to date and  lasting 15 to 20 minutes of a 25 minute visit    Each maintenance medication was reviewed in detail including most importantly the difference between maintenance and prns and under what circumstances the prns are to be triggered using an action plan format that is not reflected in the computer generated alphabetically organized AVS.    Please see instructions for details  which were reviewed in writing and the patient given a copy highlighting the part that I personally wrote and discussed at today's ov.

## 2015-11-21 ENCOUNTER — Encounter: Payer: Self-pay | Admitting: Internal Medicine

## 2015-11-27 ENCOUNTER — Encounter: Payer: Self-pay | Admitting: Allergy and Immunology

## 2015-11-27 ENCOUNTER — Ambulatory Visit (INDEPENDENT_AMBULATORY_CARE_PROVIDER_SITE_OTHER): Payer: Medicare Other | Admitting: Allergy and Immunology

## 2015-11-27 ENCOUNTER — Other Ambulatory Visit: Payer: Self-pay | Admitting: Allergy and Immunology

## 2015-11-27 VITALS — BP 152/86 | HR 82 | Resp 16 | Ht 60.0 in | Wt 165.0 lb

## 2015-11-27 DIAGNOSIS — J387 Other diseases of larynx: Secondary | ICD-10-CM

## 2015-11-27 DIAGNOSIS — J4520 Mild intermittent asthma, uncomplicated: Secondary | ICD-10-CM | POA: Diagnosis not present

## 2015-11-27 DIAGNOSIS — K219 Gastro-esophageal reflux disease without esophagitis: Secondary | ICD-10-CM

## 2015-11-27 DIAGNOSIS — J3089 Other allergic rhinitis: Secondary | ICD-10-CM

## 2015-11-27 NOTE — Patient Instructions (Addendum)
  1. Continue Prevacid 30 mg twice a day  2. Continue ranitidine 300 mg in the evening  3. Continue Delsym and Chlor-Trimeton  4. Continue montelukast 10 mg daily  5. Continue Allegra 180 one tablet once a day  6. Continue ProAir HFA if needed  7. Evaluation of throat with ENT physician  8. Replaced throat clearing with swallowing maneuver  9. Return to clinic in 4 weeks.

## 2015-11-27 NOTE — Progress Notes (Signed)
Follow-up Note  Referring Provider: Mateo Flow, MD Primary Provider: Mateo Flow, MD Date of Office Visit: 11/27/2015  Subjective:   [name] (DOB: [birth year]) is a 70 y.o. female who returns to [facility] on 11/27/2015 in re-evaluation of the following:  HPI: Reneasha returns to this clinic in reevaluation of her respiratory tract problems. I've not seen her in his clinic since April 2016. At that point in time she appeared to have a history of laryngopharyngeal reflux along with some allergic rhinitis and possible intermittent asthma.   Apparently she has continued to have significant problems this spring. She tells me that she has had a pneumonia in February 2017. She's been to the urgent care multiple times this spring and has received "lots" of prednisone for episodes associated with coughing and throat clearing and some head congestion and lots of postnasal drip.   She apparently had a CT scan and a chest CT performed by Dr. Melvyn Novas several weeks ago which did not identify any significant mucosal pathology. She had been somewhat slack about using her medications for reflux and Dr. Melvyn Novas reestablished her plan which includes Prevacid twice a day and Zantac at nighttime and is also given her Chlor-Trimeton and Delsym. She is definitely better over the course of the past 2 weeks using her plan.  She has been given an inhaler and a nebulizer in the past which she does not think has really helped her very much. At this point she has very little problem with her nose. Specifically, she does not have any congestion or inability to smell or ugly nasal discharge.     Medication List           albuterol (2.5 MG/3ML) 0.083% nebulizer solution  Commonly known as:  PROVENTIL  Take 2.5 mg by nebulization every 6 (six) hours as needed for wheezing or shortness of breath. Reported on 11/27/2015     PROAIR HFA 108 (90 Base) MCG/ACT inhaler  Generic drug:  albuterol    Inhale 2 puffs into the lungs every 6 (six) hours as needed for wheezing or shortness of breath. Reported on 11/27/2015     ALLER-CHLOR PO  Take 1 tablet by mouth every 4 (four) hours.     calcium carbonate 1500 (600 Ca) MG Tabs tablet  Commonly known as:  OSCAL  Take 1,500 mg by mouth 2 (two) times daily with a meal.     CENTRUM SILVER ADULT 50+ PO  Take 1 tablet by mouth daily.     citalopram 20 MG tablet  Commonly known as:  CELEXA  Take 20 mg by mouth daily.     colesevelam 625 MG tablet  Commonly known as:  WELCHOL  Take 625 mg by mouth daily.     cycloSPORINE 0.05 % ophthalmic emulsion  Commonly known as:  RESTASIS  1 drop 2 (two) times daily.     DELSYM 30 MG/5ML liquid  Generic drug:  dextromethorphan  Take by mouth as needed for cough.     estrogens (conjugated) 0.3 MG tablet  Commonly known as:  PREMARIN  Take 0.3 mg by mouth daily. Take daily for 21 days then do not take for 7 days.     fexofenadine 180 MG tablet  Commonly known as:  ALLEGRA  Take 180 mg by mouth daily.     fluticasone 50 MCG/ACT nasal spray  Commonly known as:  FLONASE  Place 1 spray into both nostrils daily as needed for  allergies or rhinitis. Reported on 11/27/2015     gabapentin 100 MG capsule  Commonly known as:  NEURONTIN  Take 1 capsule (100 mg total) by mouth 3 (three) times daily. One three times daily     lansoprazole 30 MG capsule  Commonly known as:  PREVACID  Take 30 mg by mouth 2 (two) times daily before a meal.     levothyroxine 50 MCG tablet  Commonly known as:  SYNTHROID, LEVOTHROID  Take 50 mcg by mouth daily.     MAXZIDE-25 37.5-25 MG tablet  Generic drug:  triamterene-hydrochlorothiazide  Take 1 tablet by mouth daily. Reported on 11/27/2015     montelukast 10 MG tablet  Commonly known as:  SINGULAIR  TAKE ONE (1) TABLET ONCE DAILY     ranitidine 300 MG capsule  Commonly known as:  ZANTAC  Take 1 capsule (300 mg total) by mouth every evening.     zolpidem 10 MG  tablet  Commonly known as:  AMBIEN  Take 10 mg by mouth as needed for sleep.        Past Medical History  Diagnosis Date  . Asthma   . Hyperlipidemia   . Allergic rhinitis     Past Surgical History  Procedure Laterality Date  . Cholecystectomy    . Appendectomy    . Vaginal hysterectomy      Allergies  Allergen Reactions  . Mercury   . Morphine And Related     Pt does not remember   . Morphine And Related     Review of systems negative except as noted in HPI / PMHx or noted below:  Review of Systems  Constitutional: Negative.   HENT: Negative.   Eyes: Negative.   Respiratory: Negative.   Cardiovascular: Negative.   Gastrointestinal: Negative.   Genitourinary: Negative.   Musculoskeletal: Negative.   Skin: Negative.   Neurological: Negative.   Endo/Heme/Allergies: Negative.   Psychiatric/Behavioral: Negative.      Objective:   Filed Vitals:   11/27/15 1526  BP: 152/86  Pulse: 82  Resp: 16   Height: 5' (152.4 cm)  Weight: 165 lb (74.844 kg)   Physical Exam  Constitutional: She is well-developed, well-nourished, and in no distress.  Raspy voice and throat clearing  HENT:  Head: Normocephalic.  Right Ear: Tympanic membrane, external ear and ear canal normal.  Left Ear: Tympanic membrane, external ear and ear canal normal.  Nose: Nose normal. No mucosal edema or rhinorrhea.  Mouth/Throat: Uvula is midline, oropharynx is clear and moist and mucous membranes are normal. No oropharyngeal exudate.  Eyes: Conjunctivae are normal.  Neck: Trachea normal. No tracheal tenderness present. No tracheal deviation present. No thyromegaly present.  Cardiovascular: Normal rate, regular rhythm, S1 normal, S2 normal and normal heart sounds.   No murmur heard. Pulmonary/Chest: Breath sounds normal. No stridor. No respiratory distress. She has no wheezes. She has no rales.  Musculoskeletal: She exhibits no edema.  Lymphadenopathy:       Head (right side): No  tonsillar adenopathy present.       Head (left side): No tonsillar adenopathy present.    She has no cervical adenopathy.  Neurological: She is alert. Gait normal.  Skin: No rash noted. She is not diaphoretic. No erythema. Nails show no clubbing.  Psychiatric: Mood and affect normal.    Diagnostics:    Spirometry was performed and demonstrated an FEV1 of 2.01 at 105 % of predicted.  The patient had an Asthma Control Test with the following results:  .  Assessment and Plan:   1. LPRD (laryngopharyngeal reflux disease)   2. Other allergic rhinitis   3. Asthma, mild intermittent, well-controlled     1. Continue Prevacid 30 mg twice a day  2. Continue ranitidine 300 mg in the evening  3. Continue Delsym and Chlor-Trimeton  4. Continue montelukast 10 mg daily  5. Continue Allegra 180 one tablet once a day  6. Continue ProAir HFA if needed  7. Evaluation of throat with ENT physician  8. Replaced throat clearing with swallowing maneuver  9. Return to clinic in 4 weeks.  Lylith has a history quite consistent with LPR and hopefully with consistent use of medications directed against this disease state she will improve. Her atopic respiratory disease appears to be a minimal issue giving rise to her complaints noted today and we'll have her continue on some montelukast and Allegra to address this issue. To be complete I think could be worthwhile to have her throat evaluated by an ear nose and throat physician and we'll get that arranged sometime in the near future. I will see her back in this clinic In approximately 4 weeks or earlier if there is a problem.   Allena Katz, MD [facility]

## 2015-11-28 MED ORDER — MONTELUKAST SODIUM 10 MG PO TABS: 10.0000 mg | ORAL_TABLET | Freq: Every day | ORAL | Status: DC

## 2015-12-08 ENCOUNTER — Ambulatory Visit (INDEPENDENT_AMBULATORY_CARE_PROVIDER_SITE_OTHER): Payer: Medicare Other | Admitting: Ophthalmology

## 2015-12-08 DIAGNOSIS — H43813 Vitreous degeneration, bilateral: Secondary | ICD-10-CM | POA: Diagnosis not present

## 2015-12-08 DIAGNOSIS — H35033 Hypertensive retinopathy, bilateral: Secondary | ICD-10-CM | POA: Diagnosis not present

## 2015-12-08 DIAGNOSIS — H35372 Puckering of macula, left eye: Secondary | ICD-10-CM | POA: Diagnosis not present

## 2015-12-08 DIAGNOSIS — I10 Essential (primary) hypertension: Secondary | ICD-10-CM | POA: Diagnosis not present

## 2015-12-25 ENCOUNTER — Ambulatory Visit: Payer: Medicare Other | Admitting: Allergy and Immunology

## 2016-01-01 DIAGNOSIS — H35031 Hypertensive retinopathy, right eye: Secondary | ICD-10-CM | POA: Diagnosis not present

## 2016-01-01 DIAGNOSIS — H2511 Age-related nuclear cataract, right eye: Secondary | ICD-10-CM | POA: Diagnosis not present

## 2016-01-01 DIAGNOSIS — H2512 Age-related nuclear cataract, left eye: Secondary | ICD-10-CM | POA: Diagnosis not present

## 2016-01-01 DIAGNOSIS — H25011 Cortical age-related cataract, right eye: Secondary | ICD-10-CM | POA: Diagnosis not present

## 2016-01-01 DIAGNOSIS — H25012 Cortical age-related cataract, left eye: Secondary | ICD-10-CM | POA: Diagnosis not present

## 2016-01-01 DIAGNOSIS — H35032 Hypertensive retinopathy, left eye: Secondary | ICD-10-CM | POA: Diagnosis not present

## 2016-01-05 DIAGNOSIS — J342 Deviated nasal septum: Secondary | ICD-10-CM | POA: Diagnosis not present

## 2016-01-05 DIAGNOSIS — K219 Gastro-esophageal reflux disease without esophagitis: Secondary | ICD-10-CM | POA: Diagnosis not present

## 2016-01-05 DIAGNOSIS — R05 Cough: Secondary | ICD-10-CM | POA: Diagnosis not present

## 2016-01-08 DIAGNOSIS — M7062 Trochanteric bursitis, left hip: Secondary | ICD-10-CM | POA: Diagnosis not present

## 2016-01-14 ENCOUNTER — Other Ambulatory Visit: Payer: Self-pay | Admitting: Allergy and Immunology

## 2016-01-27 DIAGNOSIS — H2512 Age-related nuclear cataract, left eye: Secondary | ICD-10-CM | POA: Diagnosis not present

## 2016-01-27 DIAGNOSIS — H25012 Cortical age-related cataract, left eye: Secondary | ICD-10-CM | POA: Diagnosis not present

## 2016-01-27 DIAGNOSIS — H25812 Combined forms of age-related cataract, left eye: Secondary | ICD-10-CM | POA: Diagnosis not present

## 2016-02-26 DIAGNOSIS — H2511 Age-related nuclear cataract, right eye: Secondary | ICD-10-CM | POA: Diagnosis not present

## 2016-02-26 DIAGNOSIS — H25041 Posterior subcapsular polar age-related cataract, right eye: Secondary | ICD-10-CM | POA: Diagnosis not present

## 2016-02-26 DIAGNOSIS — H25011 Cortical age-related cataract, right eye: Secondary | ICD-10-CM | POA: Diagnosis not present

## 2016-03-16 DIAGNOSIS — H2511 Age-related nuclear cataract, right eye: Secondary | ICD-10-CM | POA: Diagnosis not present

## 2016-03-16 DIAGNOSIS — H25811 Combined forms of age-related cataract, right eye: Secondary | ICD-10-CM | POA: Diagnosis not present

## 2016-03-16 DIAGNOSIS — H26491 Other secondary cataract, right eye: Secondary | ICD-10-CM | POA: Diagnosis not present

## 2016-03-24 DIAGNOSIS — F4321 Adjustment disorder with depressed mood: Secondary | ICD-10-CM | POA: Diagnosis not present

## 2016-03-24 DIAGNOSIS — E782 Mixed hyperlipidemia: Secondary | ICD-10-CM | POA: Diagnosis not present

## 2016-03-24 DIAGNOSIS — E039 Hypothyroidism, unspecified: Secondary | ICD-10-CM | POA: Diagnosis not present

## 2016-03-24 DIAGNOSIS — I10 Essential (primary) hypertension: Secondary | ICD-10-CM | POA: Diagnosis not present

## 2016-03-24 DIAGNOSIS — Z23 Encounter for immunization: Secondary | ICD-10-CM | POA: Diagnosis not present

## 2016-03-25 DIAGNOSIS — J0100 Acute maxillary sinusitis, unspecified: Secondary | ICD-10-CM | POA: Diagnosis not present

## 2016-05-10 ENCOUNTER — Encounter (INDEPENDENT_AMBULATORY_CARE_PROVIDER_SITE_OTHER): Payer: Medicare Other | Admitting: Ophthalmology

## 2016-05-10 DIAGNOSIS — I10 Essential (primary) hypertension: Secondary | ICD-10-CM | POA: Diagnosis not present

## 2016-05-10 DIAGNOSIS — H35033 Hypertensive retinopathy, bilateral: Secondary | ICD-10-CM

## 2016-05-10 DIAGNOSIS — H43813 Vitreous degeneration, bilateral: Secondary | ICD-10-CM | POA: Diagnosis not present

## 2016-05-10 DIAGNOSIS — H35372 Puckering of macula, left eye: Secondary | ICD-10-CM | POA: Diagnosis not present

## 2016-05-17 DIAGNOSIS — J209 Acute bronchitis, unspecified: Secondary | ICD-10-CM | POA: Diagnosis not present

## 2016-05-24 DIAGNOSIS — H35372 Puckering of macula, left eye: Secondary | ICD-10-CM

## 2016-05-24 HISTORY — DX: Puckering of macula, left eye: H35.372

## 2016-05-24 NOTE — H&P (Signed)
[name] is an 70 y.o. female.   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 Allergic rhinitis   . Asthma   . Hyperlipidemia     Past Surgical History:  Procedure Laterality Date  . APPENDECTOMY    . CHOLECYSTECTOMY    . VAGINAL HYSTERECTOMY      Family History  Problem Relation Age of Onset  . Asthma Mother    Social History:  reports that she has never smoked. She has never used smokeless tobacco. She reports that she does not drink alcohol or use drugs.  Allergies:  Allergies  Allergen Reactions  . Mercury   . Morphine And Related     Pt does not remember   . Morphine And Related     No prescriptions prior to admission.    Review of systems otherwise negative  There were no vitals taken for this visit.  Physical exam: Mental status: oriented x3. Eyes: See eye exam associated with this date of surgery in media tab.  Scanned in by scanning center Ears, Nose, Throat: within normal limits Neck: Within Normal limits General: within normal limits Chest: Within normal limits Breast: deferred Heart: Within normal limits Abdomen: Within normal limits GU: deferred Extremities: within normal limits Skin: within normal limits  Assessment/Plan Preretinal fibrosis left eye.  Possible macular hole left eye Plan: To [name] for Pars plana vitrectomy, membrane peel, serum patch, laser, gas injection left eye  [name] 05/24/2016, 12:33 PM

## 2016-06-07 DIAGNOSIS — Z01818 Encounter for other preprocedural examination: Secondary | ICD-10-CM | POA: Diagnosis not present

## 2016-06-14 ENCOUNTER — Encounter (HOSPITAL_COMMUNITY): Payer: Self-pay | Admitting: *Deleted

## 2016-06-14 NOTE — Progress Notes (Signed)
Pt denies SOB, chest pain, and being under the care of a cardiologist. Pt denies having a stress test, echo and cardiac cath. Pt denies having a chest x ray within the last year. Pt stated that an EKG was performed at Dr. Laurelyn Sickle office; records requested. Pt made aware to stop taking Aspirin ( doesn't take) ,vitamins, fish oil and herbal medications. Do not take any NSAIDs ie: Ibuprofen, Advil, Naproxen,BC and Goody Powder or any medication containing Aspirin. Pt advised to bring inhale in DOS. Pt verbalized understanding of all pre-op instructions.

## 2016-06-15 ENCOUNTER — Ambulatory Visit (HOSPITAL_COMMUNITY): Admission: RE | Admit: 2016-06-15 | Payer: Medicare Other | Source: Ambulatory Visit | Admitting: Ophthalmology

## 2016-06-15 ENCOUNTER — Encounter (INDEPENDENT_AMBULATORY_CARE_PROVIDER_SITE_OTHER): Payer: Medicare Other | Admitting: Ophthalmology

## 2016-06-15 HISTORY — DX: Other complications of anesthesia, initial encounter: T88.59XA

## 2016-06-15 HISTORY — DX: Puckering of macula, left eye: H35.372

## 2016-06-15 HISTORY — DX: Adverse effect of unspecified anesthetic, initial encounter: T41.45XA

## 2016-06-15 HISTORY — DX: Macular cyst, hole, or pseudohole, left eye: H35.342

## 2016-06-15 HISTORY — DX: Gastro-esophageal reflux disease without esophagitis: K21.9

## 2016-06-15 HISTORY — DX: Personal history of other diseases of the digestive system: Z87.19

## 2016-06-15 HISTORY — DX: Personal history of urinary calculi: Z87.442

## 2016-06-15 SURGERY — 25 GAUGE PARS PLANA VITRECTOMY WITH 20 GAUGE MVR PORT FOR MACULAR HOLE
Anesthesia: General | Laterality: Left

## 2016-06-18 ENCOUNTER — Inpatient Hospital Stay (INDEPENDENT_AMBULATORY_CARE_PROVIDER_SITE_OTHER): Payer: Medicare Other | Admitting: Ophthalmology

## 2016-07-13 DIAGNOSIS — Z6829 Body mass index (BMI) 29.0-29.9, adult: Secondary | ICD-10-CM | POA: Diagnosis not present

## 2016-07-13 DIAGNOSIS — Z779 Other contact with and (suspected) exposures hazardous to health: Secondary | ICD-10-CM | POA: Diagnosis not present

## 2016-07-13 DIAGNOSIS — Z124 Encounter for screening for malignant neoplasm of cervix: Secondary | ICD-10-CM | POA: Diagnosis not present

## 2016-07-17 ENCOUNTER — Other Ambulatory Visit: Payer: Self-pay | Admitting: Allergy and Immunology

## 2016-08-19 DIAGNOSIS — H5712 Ocular pain, left eye: Secondary | ICD-10-CM | POA: Diagnosis not present

## 2016-08-19 DIAGNOSIS — H40013 Open angle with borderline findings, low risk, bilateral: Secondary | ICD-10-CM | POA: Diagnosis not present

## 2016-08-19 DIAGNOSIS — H18452 Nodular corneal degeneration, left eye: Secondary | ICD-10-CM | POA: Diagnosis not present

## 2016-08-19 DIAGNOSIS — H04123 Dry eye syndrome of bilateral lacrimal glands: Secondary | ICD-10-CM | POA: Diagnosis not present

## 2016-08-26 DIAGNOSIS — J0120 Acute ethmoidal sinusitis, unspecified: Secondary | ICD-10-CM | POA: Diagnosis not present

## 2016-10-06 DIAGNOSIS — J0100 Acute maxillary sinusitis, unspecified: Secondary | ICD-10-CM | POA: Diagnosis not present

## 2016-10-21 ENCOUNTER — Encounter (INDEPENDENT_AMBULATORY_CARE_PROVIDER_SITE_OTHER): Payer: Medicare Other | Admitting: Ophthalmology

## 2016-10-21 DIAGNOSIS — H35373 Puckering of macula, bilateral: Secondary | ICD-10-CM | POA: Diagnosis not present

## 2016-10-21 DIAGNOSIS — H26493 Other secondary cataract, bilateral: Secondary | ICD-10-CM | POA: Diagnosis not present

## 2016-10-21 DIAGNOSIS — H35033 Hypertensive retinopathy, bilateral: Secondary | ICD-10-CM

## 2016-10-21 DIAGNOSIS — I10 Essential (primary) hypertension: Secondary | ICD-10-CM

## 2016-10-21 DIAGNOSIS — H43813 Vitreous degeneration, bilateral: Secondary | ICD-10-CM

## 2016-11-08 ENCOUNTER — Other Ambulatory Visit (INDEPENDENT_AMBULATORY_CARE_PROVIDER_SITE_OTHER): Payer: Medicare Other | Admitting: Ophthalmology

## 2016-11-23 ENCOUNTER — Other Ambulatory Visit (INDEPENDENT_AMBULATORY_CARE_PROVIDER_SITE_OTHER): Payer: Medicare Other | Admitting: Ophthalmology

## 2016-11-23 ENCOUNTER — Encounter (INDEPENDENT_AMBULATORY_CARE_PROVIDER_SITE_OTHER): Payer: Medicare Other | Admitting: Ophthalmology

## 2016-11-23 DIAGNOSIS — H26491 Other secondary cataract, right eye: Secondary | ICD-10-CM | POA: Diagnosis not present

## 2016-12-06 DIAGNOSIS — R05 Cough: Secondary | ICD-10-CM | POA: Diagnosis not present

## 2016-12-06 DIAGNOSIS — J069 Acute upper respiratory infection, unspecified: Secondary | ICD-10-CM | POA: Diagnosis not present

## 2016-12-10 ENCOUNTER — Ambulatory Visit (INDEPENDENT_AMBULATORY_CARE_PROVIDER_SITE_OTHER): Payer: Medicare Other | Admitting: Ophthalmology

## 2016-12-10 DIAGNOSIS — H2703 Aphakia, bilateral: Secondary | ICD-10-CM

## 2017-02-03 DIAGNOSIS — Z0000 Encounter for general adult medical examination without abnormal findings: Secondary | ICD-10-CM | POA: Diagnosis not present

## 2017-02-03 DIAGNOSIS — F4321 Adjustment disorder with depressed mood: Secondary | ICD-10-CM | POA: Diagnosis not present

## 2017-02-03 DIAGNOSIS — E039 Hypothyroidism, unspecified: Secondary | ICD-10-CM | POA: Diagnosis not present

## 2017-02-03 DIAGNOSIS — Z79899 Other long term (current) drug therapy: Secondary | ICD-10-CM | POA: Diagnosis not present

## 2017-02-03 DIAGNOSIS — G4700 Insomnia, unspecified: Secondary | ICD-10-CM | POA: Diagnosis not present

## 2017-02-03 DIAGNOSIS — E782 Mixed hyperlipidemia: Secondary | ICD-10-CM | POA: Diagnosis not present

## 2017-02-03 DIAGNOSIS — K580 Irritable bowel syndrome with diarrhea: Secondary | ICD-10-CM | POA: Diagnosis not present

## 2017-02-06 DIAGNOSIS — J0100 Acute maxillary sinusitis, unspecified: Secondary | ICD-10-CM | POA: Diagnosis not present

## 2017-03-17 DIAGNOSIS — E782 Mixed hyperlipidemia: Secondary | ICD-10-CM | POA: Diagnosis not present

## 2017-03-17 DIAGNOSIS — Z23 Encounter for immunization: Secondary | ICD-10-CM | POA: Diagnosis not present

## 2017-03-17 DIAGNOSIS — R799 Abnormal finding of blood chemistry, unspecified: Secondary | ICD-10-CM | POA: Diagnosis not present

## 2017-03-31 DIAGNOSIS — H35363 Drusen (degenerative) of macula, bilateral: Secondary | ICD-10-CM | POA: Diagnosis not present

## 2017-03-31 DIAGNOSIS — H35371 Puckering of macula, right eye: Secondary | ICD-10-CM | POA: Diagnosis not present

## 2017-03-31 DIAGNOSIS — H40013 Open angle with borderline findings, low risk, bilateral: Secondary | ICD-10-CM | POA: Diagnosis not present

## 2017-03-31 DIAGNOSIS — H35372 Puckering of macula, left eye: Secondary | ICD-10-CM | POA: Diagnosis not present

## 2017-03-31 DIAGNOSIS — H35033 Hypertensive retinopathy, bilateral: Secondary | ICD-10-CM | POA: Diagnosis not present

## 2017-04-15 ENCOUNTER — Other Ambulatory Visit: Payer: Self-pay | Admitting: Neurosurgery

## 2017-04-15 DIAGNOSIS — M48061 Spinal stenosis, lumbar region without neurogenic claudication: Secondary | ICD-10-CM

## 2017-04-21 DIAGNOSIS — M205X2 Other deformities of toe(s) (acquired), left foot: Secondary | ICD-10-CM | POA: Insufficient documentation

## 2017-04-21 DIAGNOSIS — L600 Ingrowing nail: Secondary | ICD-10-CM | POA: Insufficient documentation

## 2017-04-21 HISTORY — DX: Other deformities of toe(s) (acquired), left foot: M20.5X2

## 2017-04-21 HISTORY — DX: Ingrowing nail: L60.0

## 2017-04-25 DIAGNOSIS — J069 Acute upper respiratory infection, unspecified: Secondary | ICD-10-CM | POA: Diagnosis not present

## 2017-04-25 DIAGNOSIS — R05 Cough: Secondary | ICD-10-CM | POA: Diagnosis not present

## 2017-04-27 ENCOUNTER — Ambulatory Visit
Admission: RE | Admit: 2017-04-27 | Discharge: 2017-04-27 | Disposition: A | Discharge status: Home / Self Care | Payer: Medicare Other | Source: Ambulatory Visit | Attending: Neurosurgery | Admitting: Neurosurgery

## 2017-04-27 DIAGNOSIS — M47817 Spondylosis without myelopathy or radiculopathy, lumbosacral region: Secondary | ICD-10-CM | POA: Diagnosis not present

## 2017-04-27 DIAGNOSIS — M48061 Spinal stenosis, lumbar region without neurogenic claudication: Secondary | ICD-10-CM

## 2017-04-27 MED ORDER — IOPAMIDOL (ISOVUE-M 200) INJECTION 41%
1.0000 mL | Freq: Once | INTRAMUSCULAR | Status: AC
  Administered 2017-04-27: 1 mL via EPIDURAL

## 2017-04-27 MED ORDER — METHYLPREDNISOLONE ACETATE 40 MG/ML INJ SUSP (RADIOLOG
120.0000 mg | Freq: Once | INTRAMUSCULAR | Status: AC
  Administered 2017-04-27: 120 mg via EPIDURAL

## 2017-04-27 NOTE — Discharge Instructions (Signed)

## 2017-04-28 DIAGNOSIS — J0120 Acute ethmoidal sinusitis, unspecified: Secondary | ICD-10-CM | POA: Diagnosis not present

## 2017-04-28 DIAGNOSIS — J309 Allergic rhinitis, unspecified: Secondary | ICD-10-CM | POA: Diagnosis not present

## 2017-04-28 DIAGNOSIS — Z6830 Body mass index (BMI) 30.0-30.9, adult: Secondary | ICD-10-CM | POA: Diagnosis not present

## 2017-06-13 IMAGING — CT CT PARANASAL SINUSES LIMITED
1 of 2 series · 9 of 12 positions shown, 12 images · non-contrast
Comparison: None.

CLINICAL DATA: History of pneumonia and bronchitis symptoms for 1
year. Sinus problems with pain and pressure in teeth and face.

EXAM:
CT PARANASAL SINUS LIMITED WITHOUT CONTRAST
TECHNIQUE: Non-contiguous multidetector CT images of the paranasal sinuses were
obtained in a single plane without contrast.

[Series 6: limited sinus st · axial · 0.28mm/px · z∈[+1336,+1416]mm · 9 of 11 slices shown, 12 images]
[im 2/11  brain]
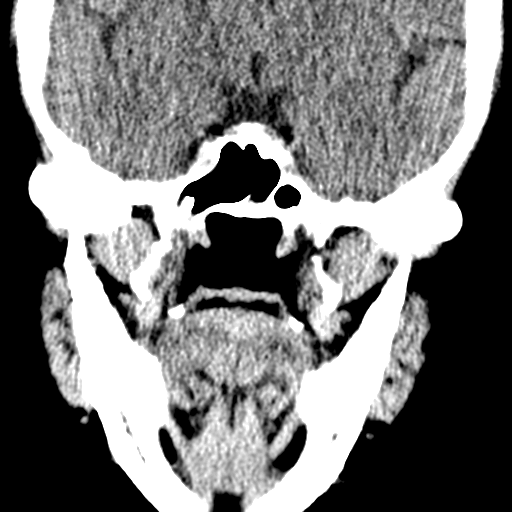
[im 2/11  bone]
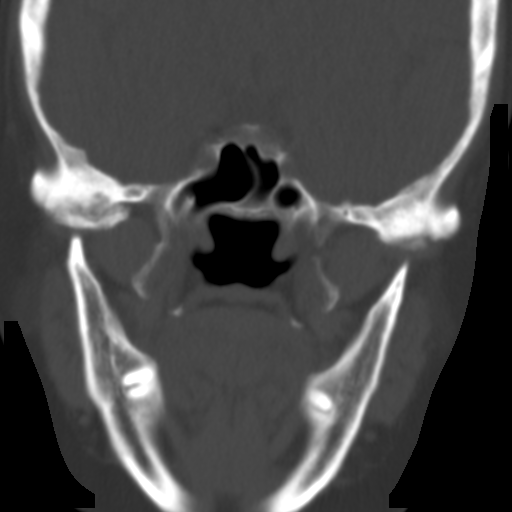
[im 3/11  bone]
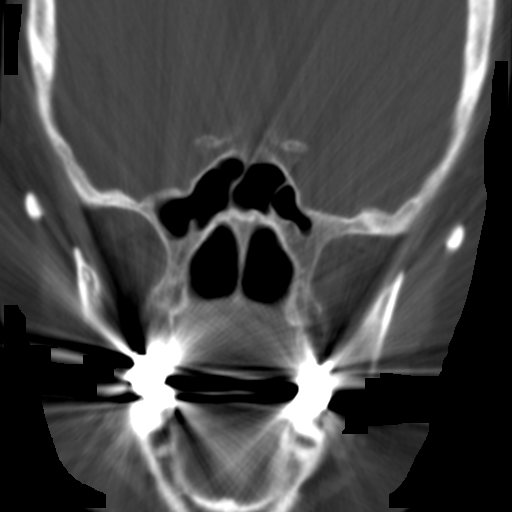
[im 4/11  bone]
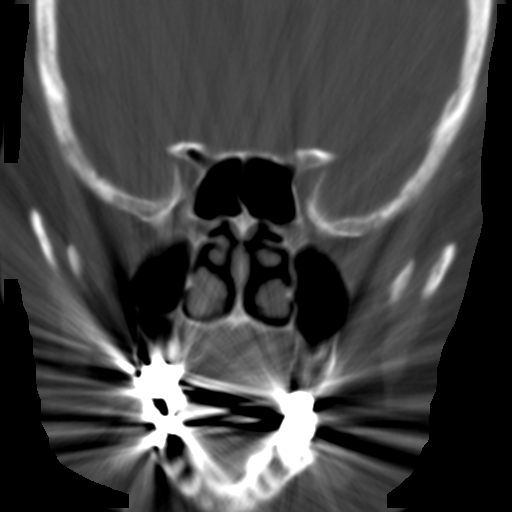
[im 5/11  bone]
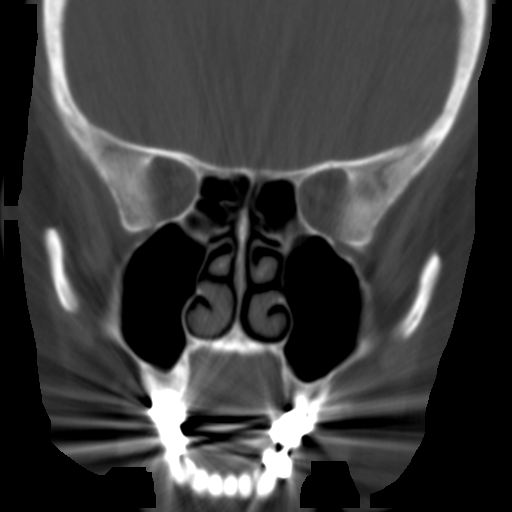
[im 6/11  brain]
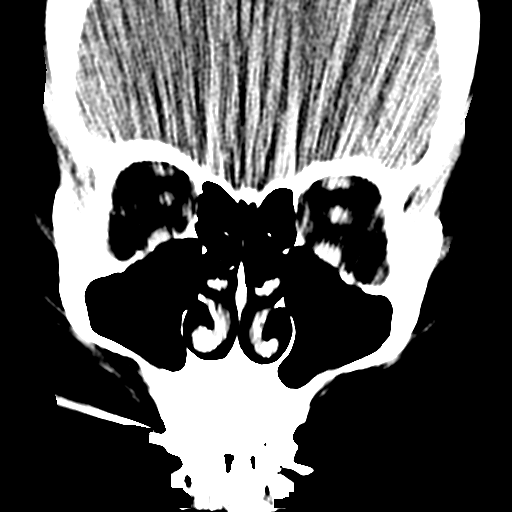
[im 6/11  bone]
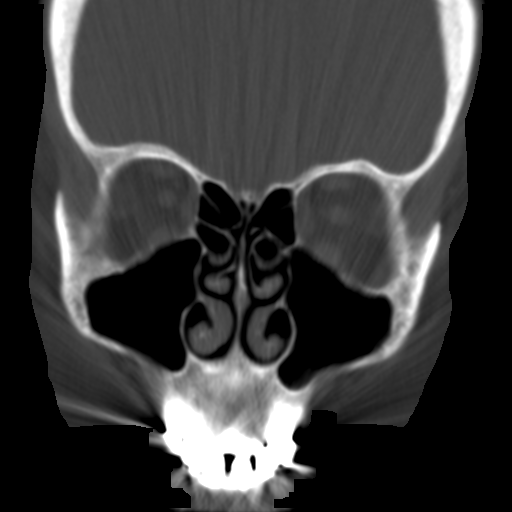
[im 7/11  bone]
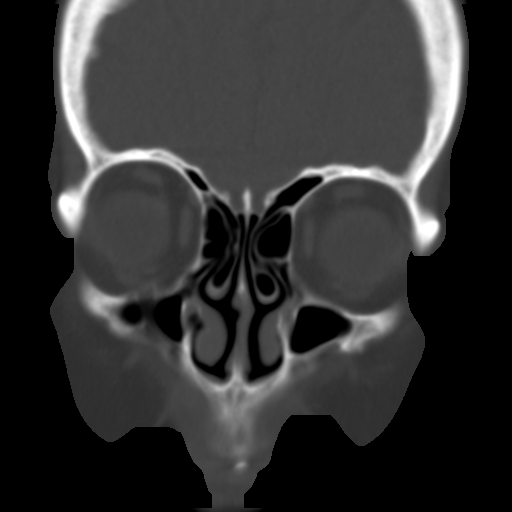
[im 8/11  bone]
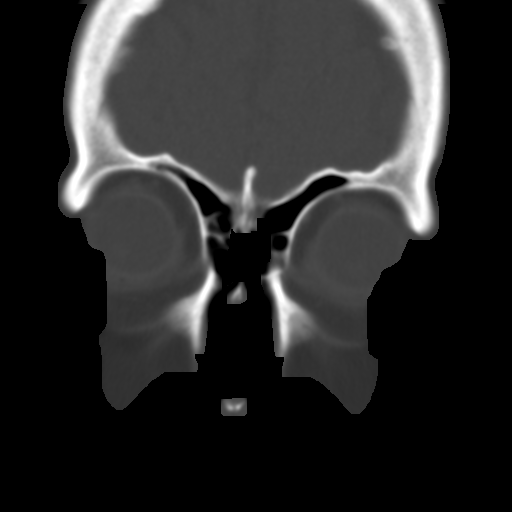
[im 9/11  bone]
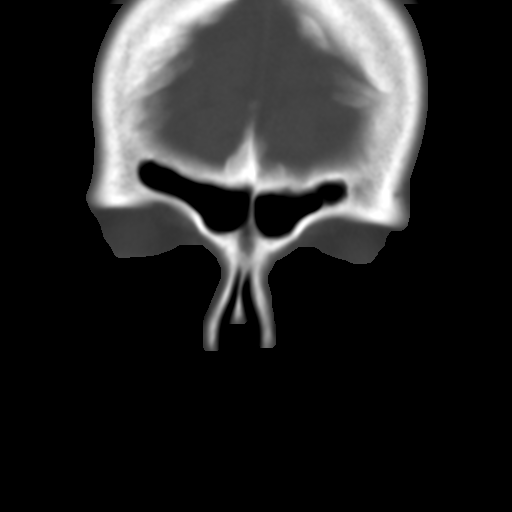
[im 10/11  brain]
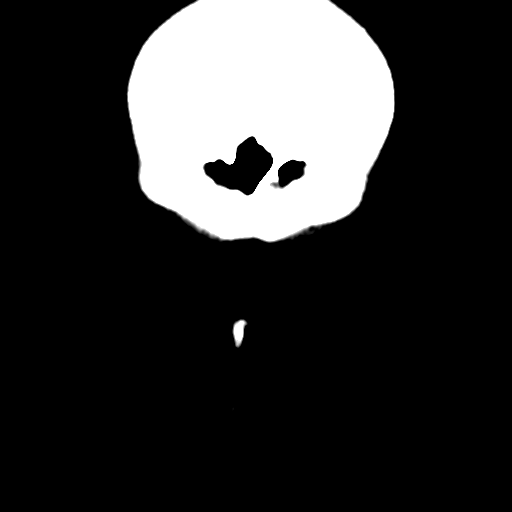
[im 10/11  bone]
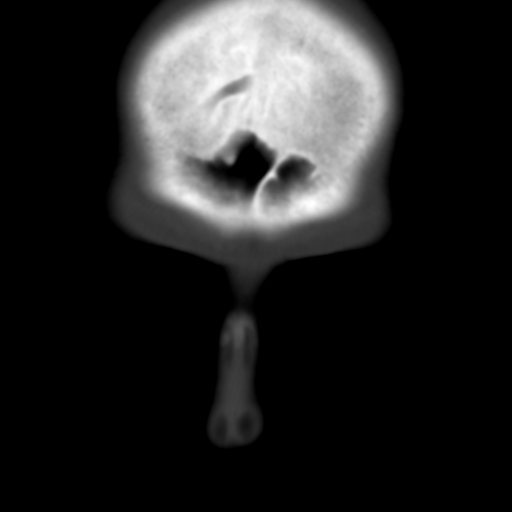

[9 of 12 positions shown; findings below may reference images not displayed]

FINDINGS: Paranasal sinuses are clear. Osseous structures about the paranasal
sinuses appear intact and normal in mineralization. Visualized soft
tissues are unremarkable.
IMPRESSION: Paranasal sinuses are clear.

## 2017-07-08 DIAGNOSIS — J0100 Acute maxillary sinusitis, unspecified: Secondary | ICD-10-CM | POA: Diagnosis not present

## 2017-07-08 DIAGNOSIS — R0982 Postnasal drip: Secondary | ICD-10-CM | POA: Diagnosis not present

## 2017-07-08 DIAGNOSIS — R05 Cough: Secondary | ICD-10-CM | POA: Diagnosis not present

## 2017-07-15 DIAGNOSIS — Z6830 Body mass index (BMI) 30.0-30.9, adult: Secondary | ICD-10-CM | POA: Diagnosis not present

## 2017-07-15 DIAGNOSIS — J329 Chronic sinusitis, unspecified: Secondary | ICD-10-CM | POA: Diagnosis not present

## 2017-07-15 DIAGNOSIS — J40 Bronchitis, not specified as acute or chronic: Secondary | ICD-10-CM | POA: Diagnosis not present

## 2017-07-15 DIAGNOSIS — J309 Allergic rhinitis, unspecified: Secondary | ICD-10-CM | POA: Diagnosis not present

## 2017-08-01 ENCOUNTER — Ambulatory Visit (INDEPENDENT_AMBULATORY_CARE_PROVIDER_SITE_OTHER): Payer: Medicare Other | Admitting: Allergy and Immunology

## 2017-08-01 ENCOUNTER — Encounter: Payer: Self-pay | Admitting: Allergy and Immunology

## 2017-08-01 VITALS — BP 136/84 | HR 80 | Temp 97.9°F | Resp 20

## 2017-08-01 DIAGNOSIS — K219 Gastro-esophageal reflux disease without esophagitis: Secondary | ICD-10-CM

## 2017-08-01 DIAGNOSIS — J3089 Other allergic rhinitis: Secondary | ICD-10-CM

## 2017-08-01 DIAGNOSIS — J4520 Mild intermittent asthma, uncomplicated: Secondary | ICD-10-CM

## 2017-08-01 MED ORDER — RANITIDINE HCL 300 MG PO TABS: 300.0000 mg | ORAL_TABLET | Freq: Every day | ORAL | 5 refills | Status: DC

## 2017-08-01 MED ORDER — LANSOPRAZOLE 30 MG PO CPDR: 30.0000 mg | DELAYED_RELEASE_CAPSULE | Freq: Two times a day (BID) | ORAL | 5 refills | Status: DC

## 2017-08-01 MED ORDER — ALBUTEROL SULFATE HFA 108 (90 BASE) MCG/ACT IN AERS: INHALATION_SPRAY | RESPIRATORY_TRACT | 1 refills | Status: DC

## 2017-08-01 MED ORDER — MONTELUKAST SODIUM 10 MG PO TABS: 10.0000 mg | ORAL_TABLET | Freq: Every day | ORAL | 5 refills | Status: DC

## 2017-08-01 NOTE — Progress Notes (Signed)
Follow-up Note  Referring Provider: Mateo Flow, MD Primary Provider: Mateo Flow, MD Date of Office Visit: 08/01/2017  Subjective:   [name] (DOB: 1946/04/21) is a 72 y.o. female who returns to the [facility] on 08/01/2017 in re-evaluation of the following:  HPI: [name] returns to this clinic in evaluation of cough and other respiratory tract symptoms.  I have not seen her in this clinic since 27 November 2015.  Tricha has a history of significant LPR and rhinitis and a possible component of intermittent asthma that has been evaluated in 2017 with a CT scan of her chest and an evaluation with an ENT doctor which did not identify any significant pathological process involving her lungs or head other than LPR and rhinitis and asthma.  She has slowly tapered off her therapy for reflux over the course of the past year because of a "creatinine" problem.  As a result she has developed significant problems with a persistent cough and raspy voice and intermittent sore throat and drainage.  In addition she has ear fullness and some intermittent headaches.  She does not have a history of anosmia or ugly nasal discharge.  She does not have any wheezing or coughing or shortness of breath and does not use a short acting bronchodilator.  She did obtain the flu vaccine this year.  Allergies as of 08/01/2017      Reactions   Mercury    Morphine And Related    Pt does not remember      Medication List      albuterol (2.5 MG/3ML) 0.083% nebulizer solution Commonly known as:  PROVENTIL Take 2.5 mg by nebulization every 6 (six) hours as needed for wheezing or shortness of breath. Reported on 11/27/2015   PROAIR HFA 108 (90 Base) MCG/ACT inhaler Generic drug:  albuterol Inhale 2 puffs into the lungs every 6 (six) hours as needed for wheezing or shortness of breath. Reported on 11/27/2015   ALLER-CHLOR PO Take 1 tablet by mouth every 4 (four) hours as needed (allergies).     buPROPion 150 MG 12 hr tablet Commonly known as:  WELLBUTRIN SR Take 150 mg by mouth daily.   calcium carbonate 1500 (600 Ca) MG Tabs tablet Commonly known as:  OSCAL Take 1,500 mg by mouth 2 (two) times daily with a meal.   CENTRUM SILVER ADULT 50+ PO Take 1 tablet by mouth daily.   cycloSPORINE 0.05 % ophthalmic emulsion Commonly known as:  RESTASIS 1 drop 2 (two) times daily.   DELSYM 30 MG/5ML liquid Generic drug:  dextromethorphan Take 30 mg by mouth at bedtime as needed for cough.   dicyclomine 10 MG capsule Commonly known as:  BENTYL Take 10 mg by mouth 3 (three) times daily as needed. Abdominal spasms   estrogens (conjugated) 0.3 MG tablet Commonly known as:  PREMARIN Take 0.3 mg by mouth daily. Take daily for 21 days then do not take for 7 days.   fexofenadine 180 MG tablet Commonly known as:  ALLEGRA Take 180 mg by mouth every evening.   fluticasone 50 MCG/ACT nasal spray Commonly known as:  FLONASE Place 1 spray into both nostrils daily as needed for allergies or rhinitis. Reported on 11/27/2015   ipratropium 0.06 % nasal spray Commonly known as:  ATROVENT   lansoprazole 30 MG capsule Commonly known as:  PREVACID Take 30 mg by mouth 2 (two) times daily before a meal.   levothyroxine 50 MCG tablet Commonly known  as:  SYNTHROID, LEVOTHROID Take 50 mcg by mouth daily.   triamterene-hydrochlorothiazide 37.5-25 MG capsule Commonly known as:  DYAZIDE   zolpidem 10 MG tablet Commonly known as:  AMBIEN Take 10 mg by mouth as needed for sleep.       Past Medical History:  Diagnosis Date  . Allergic rhinitis   . Asthma   . Complication of anesthesia    Pt stated that anesthesia was not effective in previous cataract surgery. Pt stated " I could feel what they were doing; pt stated that during an Epidural i tought my legs were up but they were down"  . GERD (gastroesophageal reflux disease)   . History of hiatal hernia   . History of kidney stones   .  Hyperlipidemia   . Macular hole of left eye   . Preretinal fibrosis, left eye     Past Surgical History:  Procedure Laterality Date  . APPENDECTOMY    . CATARACT EXTRACTION W/ INTRAOCULAR LENS  IMPLANT, BILATERAL    . CHOLECYSTECTOMY    . COLONOSCOPY    . MEDIAL PARTIAL KNEE REPLACEMENT     left  . TUBAL LIGATION    . VAGINAL HYSTERECTOMY      Review of systems negative except as noted in HPI / PMHx or noted below:  Review of Systems  Constitutional: Negative.   HENT: Negative.   Eyes: Negative.   Respiratory: Negative.   Cardiovascular: Negative.   Gastrointestinal: Negative.   Genitourinary: Negative.   Musculoskeletal: Negative.   Skin: Negative.   Neurological: Negative.   Endo/Heme/Allergies: Negative.   Psychiatric/Behavioral: Negative.      Objective:   Vitals:   08/01/17 0850  BP: 136/84  Pulse: 80  Resp: 20  Temp: 97.9 F (36.6 C)          Physical Exam  Constitutional: She is well-developed, well-nourished, and in no distress.  Raspy voice  HENT:  Head: Normocephalic.  Right Ear: Tympanic membrane, external ear and ear canal normal.  Left Ear: Tympanic membrane, external ear and ear canal normal.  Nose: Nose normal. No mucosal edema or rhinorrhea.  Mouth/Throat: Uvula is midline, oropharynx is clear and moist and mucous membranes are normal. No oropharyngeal exudate.  Eyes: Conjunctivae are normal.  Neck: Trachea normal. No tracheal tenderness present. No tracheal deviation present. No thyromegaly present.  Cardiovascular: Normal rate, regular rhythm, S1 normal, S2 normal and normal heart sounds.  No murmur heard. Pulmonary/Chest: Breath sounds normal. No stridor. No respiratory distress. She has no wheezes. She has no rales.  Musculoskeletal: She exhibits no edema.  Lymphadenopathy:       Head (right side): No tonsillar adenopathy present.       Head (left side): No tonsillar adenopathy present.    She has no cervical adenopathy.    Neurological: She is alert. Gait normal.  Skin: No rash noted. She is not diaphoretic. No erythema. Nails show no clubbing.  Psychiatric: Mood and affect normal.    Diagnostics:   Review of note from [facility] ear nose and throat dated 05 January 2016 refers to a rhinoscopic examination documenting significant arytenoid edema.  Assessment and Plan:   1. LPRD (laryngopharyngeal reflux disease)   2. Other allergic rhinitis   3. Asthma, mild intermittent, well-controlled     1. Restart Prevacid 30 mg twice a day  2. Restart ranitidine 300 mg in the evening  3. Restart montelukast 10 mg daily  4. Restart Flonase or Nasacort one spray each nostril once  a day  5. Continue Allegra 180 one tablet once a day  6. Continue ProAir HFA if needed  7.Continue Delsym and Chlor-Trimeton if needed  8. Return to clinic in 4 weeks. Further evaluation or treatment?  9. Review blood test 2018  I think that Kamaya has once again developed significant problems with inflammation of her airway most likely secondary to her reflux disease.  I will treat her with the therapy noted above and address her response over the course of the next 4 weeks.  In addition, I will look at her creatinine level that apparently was elevated at some point in the past year or so and we may need to monitor this while she continues to use aggressive therapy directed against reflux.  I will see her back in this clinic in 4 weeks or earlier if there is a problem.  Allena Katz, MD Allergy / Immunology [place]

## 2017-08-01 NOTE — Patient Instructions (Addendum)
  1. Restart Prevacid 30 mg twice a day  2. Restart ranitidine 300 mg in the evening  3. Restart montelukast 10 mg daily  4. Restart Flonase or Nasacort one spray each nostril once a day  5. Continue Allegra 180 one tablet once a day  6. Continue ProAir HFA if needed  7.Continue Delsym and Chlor-Trimeton if needed  8. Return to clinic in 4 weeks. Further evaluation or treatment?  9. Review blood test 2018

## 2017-08-02 ENCOUNTER — Encounter: Payer: Self-pay | Admitting: Allergy and Immunology

## 2017-08-02 ENCOUNTER — Telehealth: Payer: Self-pay | Admitting: *Deleted

## 2017-08-02 NOTE — Telephone Encounter (Signed)
Left message for patient to call back regarding the blood work she had done back in August 2018. Dr. Neldon Mc wants to order a Basic Metabolic Panel to re-check her Creatinine which was elevated. Paper results will be scanned in.

## 2017-08-05 ENCOUNTER — Telehealth: Payer: Self-pay | Admitting: *Deleted

## 2017-08-05 MED ORDER — BENZONATATE 100 MG PO CAPS: ORAL_CAPSULE | ORAL | 0 refills | Status: DC

## 2017-08-05 NOTE — Telephone Encounter (Signed)
Patient advised of instructions and rx sent

## 2017-08-05 NOTE — Addendum Note (Signed)
Addended by: Carin Hock on: 08/05/2017 02:48 PM   Modules accepted: Orders

## 2017-08-05 NOTE — Telephone Encounter (Signed)
Seen by Dr Neldon Mc on Mon. Feb 4 and he suggested takng Delsym for cough.  She advised she has been taking same but cough worse can't sleep and coughs all day

## 2017-08-05 NOTE — Telephone Encounter (Signed)
Can you please order sone tessalon perles 100 mg 1-2 capsules every 8 hours as needed for cough. When she gets her blood work done we will have a better idea of other medications that will be helpful based on her kidney function. Thank you

## 2017-08-05 NOTE — Telephone Encounter (Signed)
Patient advised she would like same done at [facility] will send note to them to see if they will do same

## 2017-08-11 ENCOUNTER — Telehealth: Payer: Self-pay | Admitting: *Deleted

## 2017-08-11 NOTE — Telephone Encounter (Signed)
[name] states that her cough is no better and that it is pretty much constant.  She says the [employer] and Delsym aren't touching the cough. Is there something else that you can prescribe? Please advise.

## 2017-08-15 ENCOUNTER — Ambulatory Visit (INDEPENDENT_AMBULATORY_CARE_PROVIDER_SITE_OTHER): Payer: Medicare Other | Admitting: Allergy and Immunology

## 2017-08-15 ENCOUNTER — Encounter: Payer: Self-pay | Admitting: Allergy and Immunology

## 2017-08-15 VITALS — BP 130/84 | HR 92 | Resp 20

## 2017-08-15 DIAGNOSIS — J3089 Other allergic rhinitis: Secondary | ICD-10-CM

## 2017-08-15 DIAGNOSIS — K219 Gastro-esophageal reflux disease without esophagitis: Secondary | ICD-10-CM | POA: Diagnosis not present

## 2017-08-15 DIAGNOSIS — J4520 Mild intermittent asthma, uncomplicated: Secondary | ICD-10-CM

## 2017-08-15 MED ORDER — METHYLPREDNISOLONE ACETATE 80 MG/ML IJ SUSP
80.0000 mg | Freq: Once | INTRAMUSCULAR | Status: AC
  Administered 2017-08-15: 80 mg via INTRAMUSCULAR

## 2017-08-15 MED ORDER — AMOXICILLIN-POT CLAVULANATE 875-125 MG PO TABS: ORAL_TABLET | ORAL | 0 refills | Status: DC

## 2017-08-15 NOTE — Patient Instructions (Addendum)
  1. Prevacid 30 mg twice a day  2. ranitidine 300 mg in the evening  3. montelukast 10 mg daily  4. Flonase one spray each nostril once a day  5. Continue Allegra 180 one tablet once a day  6. Continue ProAir HFA if needed  7.Continue Delsym and Chlor-Trimeton if needed  8.  Depo-Medrol 80 IM delivered in clinic today  9.  Augmentin 875 1 tablet twice a day for the next 10 days  10. Obtain Chest X-ray  11. Further evaluation and treatment?

## 2017-08-15 NOTE — Progress Notes (Signed)
Follow-up Note  Referring Provider: Mateo Flow, MD Primary Provider: Mateo Flow, MD Date of Office Visit: 08/15/2017  Subjective:   [name] (DOB: [birth year]) is a 72 y.o. female who returns to the [facility] on 08/15/2017 in re-evaluation of the following:  HPI: Marigny returns to this clinic in evaluation of cough.  I last saw her in this clinic on 01 August 2017 at which point in time she presented to this clinic with a cough believed secondary to reflux at which point in time I had her restart all of her antireflux medications.  She is not really that much better.  She still has lots of throat clearing and spells of cough associated with some gagging but no vomiting.  Interestingly, she can sleep at nighttime if she takes the tramadol that was prescribed from her pulmonologist many years ago.  She has been consistently using therapy directed against respiratory tract inflammation and reflux.  She does not have any issues at this point with nasal congestion or ear fullness or inability to smell or taste or headache.  She does not have any shortness of breath or chest tightness or chest pain or sputum production.  Does not have any classic reflux symptoms at this point.  She has a distant history of asthma and she has tried a short acting bronchodilator which has not helped her.  Allergies as of 08/15/2017      Reactions   Mercury    Morphine And Related    Pt does not remember      Medication List      albuterol 108 (90 Base) MCG/ACT inhaler Commonly known as:  PROVENTIL HFA;VENTOLIN HFA Inhale two puffs every four to six hours as needed for cough or wheeze.   ALLER-CHLOR PO Take 1 tablet by mouth every 4 (four) hours as needed (allergies).   benzonatate 100 MG capsule Commonly known as:  TESSALON Take 1-2 capsules every 8 hours as needed for cough   buPROPion 150 MG 12 hr tablet Commonly known as:  WELLBUTRIN SR Take 150 mg by mouth daily.     calcium carbonate 1500 (600 Ca) MG Tabs tablet Commonly known as:  OSCAL Take 1,500 mg by mouth 2 (two) times daily with a meal.   CENTRUM SILVER ADULT 50+ PO Take 1 tablet by mouth daily.   cycloSPORINE 0.05 % ophthalmic emulsion Commonly known as:  RESTASIS 1 drop 2 (two) times daily.   DELSYM 30 MG/5ML liquid Generic drug:  dextromethorphan Take 30 mg by mouth at bedtime as needed for cough.   dicyclomine 10 MG capsule Commonly known as:  BENTYL Take 10 mg by mouth 3 (three) times daily as needed. Abdominal spasms   estrogens (conjugated) 0.3 MG tablet Commonly known as:  PREMARIN Take 0.3 mg by mouth daily. Take daily for 21 days then do not take for 7 days.   fexofenadine 180 MG tablet Commonly known as:  ALLEGRA Take 180 mg by mouth every evening.   fluticasone 50 MCG/ACT nasal spray Commonly known as:  FLONASE Place 1 spray into both nostrils daily as needed for allergies or rhinitis. Reported on 11/27/2015   ipratropium 0.06 % nasal spray Commonly known as:  ATROVENT   lansoprazole 30 MG capsule Commonly known as:  PREVACID Take 1 capsule (30 mg total) by mouth 2 (two) times daily before a meal.   levothyroxine 50 MCG tablet Commonly known as:  SYNTHROID, LEVOTHROID Take 50 mcg by  mouth daily.   montelukast 10 MG tablet Commonly known as:  SINGULAIR Take 1 tablet (10 mg total) by mouth at bedtime.   ranitidine 300 MG tablet Commonly known as:  ZANTAC Take 1 tablet (300 mg total) by mouth at bedtime.   traMADol 50 MG tablet Commonly known as:  ULTRAM Take 50 mg by mouth as needed.   triamterene-hydrochlorothiazide 37.5-25 MG capsule Commonly known as:  DYAZIDE   zolpidem 10 MG tablet Commonly known as:  AMBIEN Take 10 mg by mouth as needed for sleep.       Past Medical History:  Diagnosis Date  . Allergic rhinitis   . Asthma   . Complication of anesthesia    Pt stated that anesthesia was not effective in previous cataract surgery. Pt stated  " I could feel what they were doing; pt stated that during an Epidural i tought my legs were up but they were down"  . GERD (gastroesophageal reflux disease)   . History of hiatal hernia   . History of kidney stones   . Hyperlipidemia   . Macular hole of left eye   . Preretinal fibrosis, left eye     Past Surgical History:  Procedure Laterality Date  . APPENDECTOMY    . CATARACT EXTRACTION W/ INTRAOCULAR LENS  IMPLANT, BILATERAL    . CHOLECYSTECTOMY    . COLONOSCOPY    . MEDIAL PARTIAL KNEE REPLACEMENT     left  . TUBAL LIGATION    . VAGINAL HYSTERECTOMY      Review of systems negative except as noted in HPI / PMHx or noted below:  Review of Systems  Constitutional: Negative.   HENT: Negative.   Eyes: Negative.   Respiratory: Negative.   Cardiovascular: Negative.   Gastrointestinal: Negative.   Genitourinary: Negative.   Musculoskeletal: Negative.   Skin: Negative.   Neurological: Negative.   Endo/Heme/Allergies: Negative.   Psychiatric/Behavioral: Negative.      Objective:   Vitals:   08/15/17 1153  BP: 130/84  Pulse: 92  Resp: 20          Physical Exam  Constitutional: She is well-developed, well-nourished, and in no distress.  Throat clearing  HENT:  Head: Normocephalic.  Right Ear: Tympanic membrane, external ear and ear canal normal.  Left Ear: Tympanic membrane, external ear and ear canal normal.  Nose: Nose normal. No mucosal edema or rhinorrhea.  Mouth/Throat: Uvula is midline, oropharynx is clear and moist and mucous membranes are normal. No oropharyngeal exudate.  Eyes: Conjunctivae are normal.  Neck: Trachea normal. No tracheal tenderness present. No tracheal deviation present. No thyromegaly present.  Cardiovascular: Normal rate, regular rhythm, S1 normal, S2 normal and normal heart sounds.  No murmur heard. Pulmonary/Chest: Breath sounds normal. No stridor. No respiratory distress. She has no wheezes. She has no rales.  Musculoskeletal: She  exhibits no edema.  Lymphadenopathy:       Head (right side): No tonsillar adenopathy present.       Head (left side): No tonsillar adenopathy present.    She has no cervical adenopathy.  Neurological: She is alert. Gait normal.  Skin: No rash noted. She is not diaphoretic. No erythema. Nails show no clubbing.  Psychiatric: Mood and affect normal.    Diagnostics:    Spirometry was performed and demonstrated an FEV1 of 1.96 at 104 % of predicted.  The patient had an Asthma Control Test with the following results: ACT Total Score: 12.    Assessment and Plan:   1.  Other allergic rhinitis   2. LPRD (laryngopharyngeal reflux disease)   3. Asthma, mild intermittent, well-controlled     1. Prevacid 30 mg twice a day  2. ranitidine 300 mg in the evening  3. montelukast 10 mg daily  4. Flonase one spray each nostril once a day  5. Continue Allegra 180 one tablet once a day  6. Continue ProAir HFA if needed  7. Continue Delsym and Chlor-Trimeton if needed  8.  Depo-Medrol 80 IM delivered in clinic today  9.  Augmentin 875 1 tablet twice a day for the next 10 days  10. Obtain Chest X-ray  11. Further evaluation and treatment?  It is not entirely clear why Conley still continues to have issues with cough in the face of treatment directed against respiratory tract inflammation and reflux.  We will image her lower airway and I will give her a broad-spectrum antibiotic and a systemic steroid assuming that there may be an infectious component contributing to this cough.  In the past she has had extensive evaluation for her cough and reflux appeared to be the major issue behind this process but may be she has expanded into something different recently.  We will see how things go over the course of the next week or 2 and review her chest x-ray results and make a decision about how to proceed pending her response.  Allena Katz, MD Allergy / Immunology [place]

## 2017-08-15 NOTE — Telephone Encounter (Signed)
She will come in at 11:30 today.

## 2017-08-15 NOTE — Telephone Encounter (Signed)
Please have her come in around lunch today

## 2017-08-16 ENCOUNTER — Encounter: Payer: Self-pay | Admitting: Allergy and Immunology

## 2017-08-29 ENCOUNTER — Encounter: Payer: Self-pay | Admitting: Allergy and Immunology

## 2017-08-29 ENCOUNTER — Ambulatory Visit (INDEPENDENT_AMBULATORY_CARE_PROVIDER_SITE_OTHER): Payer: Medicare Other | Admitting: Allergy and Immunology

## 2017-08-29 VITALS — BP 138/88 | HR 92 | Resp 16

## 2017-08-29 DIAGNOSIS — J4520 Mild intermittent asthma, uncomplicated: Secondary | ICD-10-CM

## 2017-08-29 DIAGNOSIS — J3089 Other allergic rhinitis: Secondary | ICD-10-CM

## 2017-08-29 DIAGNOSIS — K219 Gastro-esophageal reflux disease without esophagitis: Secondary | ICD-10-CM

## 2017-08-29 NOTE — Patient Instructions (Signed)
  1. Continue Prevacid 30 mg twice a day  2. Continue ranitidine 300 mg in the evening  3. Continue montelukast 10 mg daily  4. Continue Flonase one spray each nostril once a day  5. If needed:   A. Allegra 180 one tablet once a day  B. ProAir HFA if needed  C. Delsym   D. Chlor-Trimeton if needed  6. Return to clinic in May 2019 or earlier if problem  7. Replace throat clearing with swallowing / drinking

## 2017-08-29 NOTE — Progress Notes (Signed)
Follow-up Note  Referring Provider: Mateo Flow, MD Primary Provider: Mateo Flow, MD Date of Office Visit: 08/29/2017  Subjective:   [name] (DOB: 11-26-1945) is a 72 y.o. female who returns to the [facility] on 08/29/2017 in re-evaluation of the following:  HPI: [name] returns to this clinic in reevaluation of her coughing with a component of asthma and allergic rhinitis and LPR addressed during her last evaluation of 15 August 2017.  Fortunately, she is finally starting to get control of her cough.  She still has a lot of throat clearing and drainage in her throat but overall is much better at this point in time.  She has dramatically consolidated all her caffeine and chocolate consumption and continues to use a large collection of medical therapy directed against both reflux and inflammation of her respiratory tract.  Rarely does she use a short acting bronchodilator.  Allergies as of 08/29/2017      Reactions   Mercury    Morphine And Related    Pt does not remember      Medication List      albuterol 108 (90 Base) MCG/ACT inhaler Commonly known as:  PROVENTIL HFA;VENTOLIN HFA Inhale two puffs every four to six hours as needed for cough or wheeze.   ALLER-CHLOR PO Take 1 tablet by mouth every 4 (four) hours as needed (allergies).   benzonatate 100 MG capsule Commonly known as:  TESSALON Take 1-2 capsules every 8 hours as needed for cough   buPROPion 150 MG 12 hr tablet Commonly known as:  WELLBUTRIN SR Take 150 mg by mouth daily.   calcium carbonate 1500 (600 Ca) MG Tabs tablet Commonly known as:  OSCAL Take 1,500 mg by mouth 2 (two) times daily with a meal.   CENTRUM SILVER ADULT 50+ PO Take 1 tablet by mouth daily.   cycloSPORINE 0.05 % ophthalmic emulsion Commonly known as:  RESTASIS 1 drop 2 (two) times daily.   DELSYM 30 MG/5ML liquid Generic drug:  dextromethorphan Take 30 mg by mouth at bedtime as needed for cough.     dicyclomine 10 MG capsule Commonly known as:  BENTYL Take 10 mg by mouth 3 (three) times daily as needed. Abdominal spasms   estrogens (conjugated) 0.3 MG tablet Commonly known as:  PREMARIN Take 0.3 mg by mouth daily. Take daily for 21 days then do not take for 7 days.   fexofenadine 180 MG tablet Commonly known as:  ALLEGRA Take 180 mg by mouth every evening.   fluticasone 50 MCG/ACT nasal spray Commonly known as:  FLONASE Place 1 spray into both nostrils daily as needed for allergies or rhinitis. Reported on 11/27/2015   ipratropium 0.06 % nasal spray Commonly known as:  ATROVENT   lansoprazole 30 MG capsule Commonly known as:  PREVACID Take 1 capsule (30 mg total) by mouth 2 (two) times daily before a meal.   levothyroxine 50 MCG tablet Commonly known as:  SYNTHROID, LEVOTHROID Take 50 mcg by mouth daily.   montelukast 10 MG tablet Commonly known as:  SINGULAIR Take 1 tablet (10 mg total) by mouth at bedtime.   ranitidine 300 MG tablet Commonly known as:  ZANTAC Take 1 tablet (300 mg total) by mouth at bedtime.   traMADol 50 MG tablet Commonly known as:  ULTRAM Take 50 mg by mouth as needed.   triamterene-hydrochlorothiazide 37.5-25 MG capsule Commonly known as:  DYAZIDE   zolpidem 10 MG tablet Commonly known as:  AMBIEN Take 10 mg by mouth as needed for sleep.       Past Medical History:  Diagnosis Date  . Allergic rhinitis   . Asthma   . Complication of anesthesia    Pt stated that anesthesia was not effective in previous cataract surgery. Pt stated " I could feel what they were doing; pt stated that during an Epidural i tought my legs were up but they were down"  . GERD (gastroesophageal reflux disease)   . History of hiatal hernia   . History of kidney stones   . Hyperlipidemia   . Macular hole of left eye   . Preretinal fibrosis, left eye     Past Surgical History:  Procedure Laterality Date  . APPENDECTOMY    . CATARACT EXTRACTION W/  INTRAOCULAR LENS  IMPLANT, BILATERAL    . CHOLECYSTECTOMY    . COLONOSCOPY    . MEDIAL PARTIAL KNEE REPLACEMENT     left  . TUBAL LIGATION    . VAGINAL HYSTERECTOMY      Review of systems negative except as noted in HPI / PMHx or noted below:  Review of Systems  Constitutional: Negative.   HENT: Negative.   Eyes: Negative.   Respiratory: Negative.   Cardiovascular: Negative.   Gastrointestinal: Negative.   Genitourinary: Negative.   Musculoskeletal: Negative.   Skin: Negative.   Neurological: Negative.   Endo/Heme/Allergies: Negative.   Psychiatric/Behavioral: Negative.      Objective:   Vitals:   08/29/17 1130  BP: 138/88  Pulse: 92  Resp: 16          Physical Exam  Constitutional: She is well-developed, well-nourished, and in no distress.  Throat clearing  HENT:  Head: Normocephalic.  Right Ear: Tympanic membrane, external ear and ear canal normal.  Left Ear: Tympanic membrane, external ear and ear canal normal.  Nose: Nose normal. No mucosal edema or rhinorrhea.  Mouth/Throat: Uvula is midline, oropharynx is clear and moist and mucous membranes are normal. No oropharyngeal exudate.  Eyes: Conjunctivae are normal.  Neck: Trachea normal. No tracheal tenderness present. No tracheal deviation present. No thyromegaly present.  Cardiovascular: Normal rate, regular rhythm, S1 normal, S2 normal and normal heart sounds.  No murmur heard. Pulmonary/Chest: Breath sounds normal. No stridor. No respiratory distress. She has no wheezes. She has no rales.  Musculoskeletal: She exhibits no edema.  Lymphadenopathy:       Head (right side): No tonsillar adenopathy present.       Head (left side): No tonsillar adenopathy present.    She has no cervical adenopathy.  Neurological: She is alert. Gait normal.  Skin: No rash noted. She is not diaphoretic. No erythema. Nails show no clubbing.  Psychiatric: Mood and affect normal.    Diagnostics:    Spirometry was performed  and demonstrated an FEV1 of 2.13 at 113 % of predicted.   Assessment and Plan:   1. Other allergic rhinitis   2. Asthma, mild intermittent, well-controlled   3. LPRD (laryngopharyngeal reflux disease)     1. Continue Prevacid 30 mg twice a day  2. Continue ranitidine 300 mg in the evening  3. Continue montelukast 10 mg daily  4. Continue Flonase one spray each nostril once a day  5. If needed:   A. Allegra 180 one tablet once a day  B. ProAir HFA if needed  C. Delsym   D. Chlor-Trimeton if needed  6. Return to clinic in May 2019 or earlier if problem  7. Replace  throat clearing with swallowing / drinking  [name] is doing very well at this point.  I would like for her to continue on aggressive therapy directed against reflux and the use of some anti-inflammatory agents for her respiratory tract as noted above for a full 12 weeks of therapy.  She will contact me during the interval should there be a problem.  Allena Katz, MD Allergy / Immunology [place]

## 2017-08-30 ENCOUNTER — Encounter: Payer: Self-pay | Admitting: Allergy and Immunology

## 2017-10-31 DIAGNOSIS — H40013 Open angle with borderline findings, low risk, bilateral: Secondary | ICD-10-CM | POA: Diagnosis not present

## 2017-10-31 DIAGNOSIS — H04123 Dry eye syndrome of bilateral lacrimal glands: Secondary | ICD-10-CM | POA: Diagnosis not present

## 2017-10-31 DIAGNOSIS — H5712 Ocular pain, left eye: Secondary | ICD-10-CM | POA: Diagnosis not present

## 2017-10-31 DIAGNOSIS — H16223 Keratoconjunctivitis sicca, not specified as Sjogren's, bilateral: Secondary | ICD-10-CM | POA: Diagnosis not present

## 2017-11-07 ENCOUNTER — Ambulatory Visit: Payer: Medicare Other | Admitting: Allergy and Immunology

## 2017-11-07 DIAGNOSIS — J309 Allergic rhinitis, unspecified: Secondary | ICD-10-CM

## 2017-11-09 DIAGNOSIS — Z6833 Body mass index (BMI) 33.0-33.9, adult: Secondary | ICD-10-CM | POA: Diagnosis not present

## 2017-11-09 DIAGNOSIS — Z01419 Encounter for gynecological examination (general) (routine) without abnormal findings: Secondary | ICD-10-CM | POA: Diagnosis not present

## 2017-11-09 DIAGNOSIS — Z780 Asymptomatic menopausal state: Secondary | ICD-10-CM | POA: Diagnosis not present

## 2017-12-09 ENCOUNTER — Ambulatory Visit (INDEPENDENT_AMBULATORY_CARE_PROVIDER_SITE_OTHER): Payer: Medicare Other | Admitting: Ophthalmology

## 2017-12-09 DIAGNOSIS — H43813 Vitreous degeneration, bilateral: Secondary | ICD-10-CM | POA: Diagnosis not present

## 2017-12-09 DIAGNOSIS — H35033 Hypertensive retinopathy, bilateral: Secondary | ICD-10-CM | POA: Diagnosis not present

## 2017-12-09 DIAGNOSIS — H35373 Puckering of macula, bilateral: Secondary | ICD-10-CM

## 2017-12-09 DIAGNOSIS — I10 Essential (primary) hypertension: Secondary | ICD-10-CM | POA: Diagnosis not present

## 2017-12-13 ENCOUNTER — Other Ambulatory Visit: Payer: Self-pay | Admitting: Neurosurgery

## 2017-12-13 DIAGNOSIS — M48061 Spinal stenosis, lumbar region without neurogenic claudication: Secondary | ICD-10-CM

## 2017-12-21 ENCOUNTER — Ambulatory Visit
Admission: RE | Admit: 2017-12-21 | Discharge: 2017-12-21 | Disposition: A | Discharge status: Home / Self Care | Payer: Medicare Other | Source: Ambulatory Visit | Attending: Neurosurgery | Admitting: Neurosurgery

## 2017-12-21 DIAGNOSIS — M545 Low back pain: Secondary | ICD-10-CM | POA: Diagnosis not present

## 2017-12-21 DIAGNOSIS — M48061 Spinal stenosis, lumbar region without neurogenic claudication: Secondary | ICD-10-CM

## 2017-12-21 MED ORDER — METHYLPREDNISOLONE ACETATE 40 MG/ML INJ SUSP (RADIOLOG
120.0000 mg | Freq: Once | INTRAMUSCULAR | Status: AC
  Administered 2017-12-21: 120 mg via EPIDURAL

## 2017-12-21 MED ORDER — IOPAMIDOL (ISOVUE-M 200) INJECTION 41%
1.0000 mL | Freq: Once | INTRAMUSCULAR | Status: AC
  Administered 2017-12-21: 1 mL via EPIDURAL

## 2018-02-03 ENCOUNTER — Other Ambulatory Visit: Payer: Self-pay | Admitting: Allergy and Immunology

## 2018-02-03 NOTE — Telephone Encounter (Signed)
COURTESY REFILL  

## 2018-02-10 ENCOUNTER — Other Ambulatory Visit: Payer: Self-pay | Admitting: *Deleted

## 2018-02-10 ENCOUNTER — Other Ambulatory Visit: Payer: Self-pay | Admitting: Allergy and Immunology

## 2018-02-10 MED ORDER — RANITIDINE HCL 300 MG PO TABS: 300.0000 mg | ORAL_TABLET | Freq: Every day | ORAL | 0 refills | Status: DC

## 2018-03-06 ENCOUNTER — Other Ambulatory Visit: Payer: Self-pay | Admitting: Allergy and Immunology

## 2018-03-06 DIAGNOSIS — B029 Zoster without complications: Secondary | ICD-10-CM | POA: Diagnosis not present

## 2018-03-06 DIAGNOSIS — R21 Rash and other nonspecific skin eruption: Secondary | ICD-10-CM | POA: Diagnosis not present

## 2018-03-06 DIAGNOSIS — R51 Headache: Secondary | ICD-10-CM | POA: Diagnosis not present

## 2018-03-06 NOTE — Telephone Encounter (Signed)
Courtesy refill Lansoprazole

## 2018-03-07 DIAGNOSIS — H16223 Keratoconjunctivitis sicca, not specified as Sjogren's, bilateral: Secondary | ICD-10-CM | POA: Diagnosis not present

## 2018-03-07 DIAGNOSIS — H5712 Ocular pain, left eye: Secondary | ICD-10-CM | POA: Diagnosis not present

## 2018-03-07 DIAGNOSIS — B029 Zoster without complications: Secondary | ICD-10-CM | POA: Diagnosis not present

## 2018-03-07 DIAGNOSIS — H04123 Dry eye syndrome of bilateral lacrimal glands: Secondary | ICD-10-CM | POA: Diagnosis not present

## 2018-03-10 ENCOUNTER — Ambulatory Visit: Payer: Medicare Other | Admitting: Family Medicine

## 2018-03-10 ENCOUNTER — Other Ambulatory Visit: Payer: Self-pay

## 2018-03-10 ENCOUNTER — Telehealth: Payer: Self-pay | Admitting: Allergy and Immunology

## 2018-03-10 MED ORDER — MONTELUKAST SODIUM 10 MG PO TABS: ORAL_TABLET | ORAL | 0 refills | Status: DC

## 2018-03-10 NOTE — Telephone Encounter (Signed)
Patient called the [facility] office to cancel her appointment in [place] due to shingles. She rescheduled for October 9. She said she will be out of her singulair by then and is requesting a refill. [facility].

## 2018-03-10 NOTE — Telephone Encounter (Signed)
30-Day Rx sent to [facility].

## 2018-03-21 ENCOUNTER — Other Ambulatory Visit: Payer: Self-pay | Admitting: Allergy and Immunology

## 2018-04-05 ENCOUNTER — Ambulatory Visit (INDEPENDENT_AMBULATORY_CARE_PROVIDER_SITE_OTHER): Payer: Medicare Other | Admitting: Allergy and Immunology

## 2018-04-05 ENCOUNTER — Encounter: Payer: Self-pay | Admitting: Allergy and Immunology

## 2018-04-05 VITALS — BP 130/76 | HR 108 | Resp 20 | Ht 60.0 in | Wt 167.0 lb

## 2018-04-05 DIAGNOSIS — J3089 Other allergic rhinitis: Secondary | ICD-10-CM

## 2018-04-05 DIAGNOSIS — J4520 Mild intermittent asthma, uncomplicated: Secondary | ICD-10-CM

## 2018-04-05 DIAGNOSIS — K219 Gastro-esophageal reflux disease without esophagitis: Secondary | ICD-10-CM | POA: Diagnosis not present

## 2018-04-05 NOTE — Progress Notes (Signed)
Follow-up Note  Referring Provider: Mateo Flow, MD Primary Provider: Mateo Flow, MD Date of Office Visit: 04/05/2018  Subjective:   [name] (DOB: [birth year]) is a 72 y.o. female who returns to the [facility] [place] on 04/05/2018 in re-evaluation of the following:  HPI: [name] presents to this clinic in reevaluation of her LPR and allergic rhinitis and a distant history of asthma.  Her last visit to this clinic was 29 August 2017.  She is under very good control regarding all of her throat issues and her coughing and rarely does use a short acting bronchodilator and she has not required either a systemic steroid or antibiotic to treat any type of respiratory tract issue.  She continues to use Prevacid twice a day and ranitidine in the evening.  Her nose has really been doing quite well with very rare and intermittent use of Nasonex along with consistent use of a leukotriene modifier.  She is now drinking decaffeinated coffee.  She has a martini about 1 time per month.  She occasionally has some chocolate.  Allergies as of 04/05/2018      Reactions   Mercury    Morphine And Related    Pt does not remember      Medication List      albuterol 108 (90 Base) MCG/ACT inhaler Commonly known as:  PROVENTIL HFA;VENTOLIN HFA Inhale two puffs every four to six hours as needed for cough or wheeze.   benzonatate 100 MG capsule Commonly known as:  TESSALON Take 1-2 capsules every 8 hours as needed for cough   buPROPion 150 MG 12 hr tablet Commonly known as:  WELLBUTRIN SR Take 150 mg by mouth daily.   calcium carbonate 1500 (600 Ca) MG Tabs tablet Commonly known as:  OSCAL Take 1,500 mg by mouth 2 (two) times daily with a meal.   CENTRUM SILVER ADULT 50+ PO Take 1 tablet by mouth daily.   citalopram 20 MG tablet Commonly known as:  CELEXA   cycloSPORINE 0.05 % ophthalmic emulsion Commonly known as:  RESTASIS 1 drop 2 (two) times daily.   DELSYM 30 MG/5ML  liquid Generic drug:  dextromethorphan Take 30 mg by mouth at bedtime as needed for cough.   dicyclomine 10 MG capsule Commonly known as:  BENTYL Take 10 mg by mouth 3 (three) times daily as needed. Abdominal spasms   estrogens (conjugated) 0.3 MG tablet Commonly known as:  PREMARIN Take 0.3 mg by mouth daily. Take daily for 21 days then do not take for 7 days.   fexofenadine 180 MG tablet Commonly known as:  ALLEGRA Take 180 mg by mouth every evening.   lansoprazole 30 MG capsule Commonly known as:  PREVACID TAKE ONE CAPSULE BY MOUTH TWICE A DAY BEFORE MEALS   levothyroxine 50 MCG tablet Commonly known as:  SYNTHROID, LEVOTHROID Take 50 mcg by mouth daily.   montelukast 10 MG tablet Commonly known as:  SINGULAIR TAKE ONE (1) TABLET AT BEDTIME   ranitidine 300 MG tablet Commonly known as:  ZANTAC TAKE ONE TABLET DAILY AT BEDTIME   triamterene-hydrochlorothiazide 37.5-25 MG capsule Commonly known as:  DYAZIDE Take 1 capsule by mouth daily.   zolpidem 10 MG tablet Commonly known as:  AMBIEN Take 10 mg by mouth as needed for sleep.       Past Medical History:  Diagnosis Date  . Allergic rhinitis   . Asthma   . Complication of anesthesia    Pt stated  that anesthesia was not effective in previous cataract surgery. Pt stated " I could feel what they were doing; pt stated that during an Epidural i tought my legs were up but they were down"  . GERD (gastroesophageal reflux disease)   . History of hiatal hernia   . History of kidney stones   . Hyperlipidemia   . Macular hole of left eye   . Preretinal fibrosis, left eye   . Shingles outbreak     Past Surgical History:  Procedure Laterality Date  . APPENDECTOMY    . CATARACT EXTRACTION W/ INTRAOCULAR LENS  IMPLANT, BILATERAL    . CHOLECYSTECTOMY    . COLONOSCOPY    . MEDIAL PARTIAL KNEE REPLACEMENT     left  . TUBAL LIGATION    . VAGINAL HYSTERECTOMY      Review of systems negative except as noted in HPI /  PMHx or noted below:  Review of Systems  Constitutional: Negative.   HENT: Negative.   Eyes: Negative.   Respiratory: Negative.   Cardiovascular: Negative.   Gastrointestinal: Negative.   Genitourinary: Negative.   Musculoskeletal: Negative.   Skin: Negative.   Neurological: Negative.   Endo/Heme/Allergies: Negative.   Psychiatric/Behavioral: Negative.      Objective:   Vitals:   04/05/18 1540  BP: 130/76  Pulse: (!) 108  Resp: 20  SpO2: 95%   Height: 5' (152.4 cm)  Weight: 167 lb (75.8 kg)   Physical Exam  HENT:  Head: Normocephalic.  Right Ear: Tympanic membrane, external ear and ear canal normal.  Left Ear: Tympanic membrane, external ear and ear canal normal.  Nose: Nose normal. No mucosal edema or rhinorrhea.  Mouth/Throat: Uvula is midline, oropharynx is clear and moist and mucous membranes are normal. No oropharyngeal exudate.  Eyes: Conjunctivae are normal.  Neck: Trachea normal. No tracheal tenderness present. No tracheal deviation present. No thyromegaly present.  Cardiovascular: Normal rate, regular rhythm, S1 normal, S2 normal and normal heart sounds.  No murmur heard. Pulmonary/Chest: Breath sounds normal. No stridor. No respiratory distress. She has no wheezes. She has no rales.  Musculoskeletal: She exhibits no edema.  Lymphadenopathy:       Head (right side): No tonsillar adenopathy present.       Head (left side): No tonsillar adenopathy present.    She has no cervical adenopathy.  Neurological: She is alert.  Skin: No rash noted. She is not diaphoretic. No erythema. Nails show no clubbing.    Diagnostics: none  Assessment and Plan:   1. Asthma, mild intermittent, well-controlled   2. Other allergic rhinitis   3. LPRD (laryngopharyngeal reflux disease)     1. Continue Prevacid 30 mg twice a day  2. Continue ranitidine 300 mg in the evening  3. Continue montelukast 10 mg daily  4. Continue nasonex one spray each nostril once a  day  5. If needed:   A. Allegra 180 one tablet once a day  B. ProAir HFA if needed  C. Delsym   D. Chlor-Trimeton if needed  6. Return to clinic in 6 months or earlier if problem  7. Obtain fall flu vaccine  [name] appears to be doing pretty good at this point in time regarding all of her airway issues while aggressively treating reflux and using some anti-inflammatory agents for her respiratory tract as specified above.  Assuming she does well I will see her back in this clinic in 6 months or earlier if there is a problem.  Allena Katz, MD  Allergy / Immunology [facility]

## 2018-04-05 NOTE — Patient Instructions (Addendum)
  1. Continue Prevacid 30 mg twice a day  2. Continue ranitidine 300 mg in the evening  3. Continue montelukast 10 mg daily  4. Continue nasonex one spray each nostril once a day  5. If needed:   A. Allegra 180 one tablet once a day  B. ProAir HFA if needed  C. Delsym   D. Chlor-Trimeton if needed  6. Return to clinic in 6 months or earlier if problem  7. Obtain fall flu vaccine

## 2018-04-06 ENCOUNTER — Encounter: Payer: Self-pay | Admitting: Allergy and Immunology

## 2018-04-13 DIAGNOSIS — Z23 Encounter for immunization: Secondary | ICD-10-CM | POA: Diagnosis not present

## 2018-04-13 DIAGNOSIS — E782 Mixed hyperlipidemia: Secondary | ICD-10-CM | POA: Diagnosis not present

## 2018-04-13 DIAGNOSIS — Z0000 Encounter for general adult medical examination without abnormal findings: Secondary | ICD-10-CM | POA: Diagnosis not present

## 2018-04-13 DIAGNOSIS — Z79899 Other long term (current) drug therapy: Secondary | ICD-10-CM | POA: Diagnosis not present

## 2018-04-13 DIAGNOSIS — Z1339 Encounter for screening examination for other mental health and behavioral disorders: Secondary | ICD-10-CM | POA: Diagnosis not present

## 2018-04-13 DIAGNOSIS — E039 Hypothyroidism, unspecified: Secondary | ICD-10-CM | POA: Diagnosis not present

## 2018-04-13 DIAGNOSIS — Z9181 History of falling: Secondary | ICD-10-CM | POA: Diagnosis not present

## 2018-04-17 DIAGNOSIS — J189 Pneumonia, unspecified organism: Secondary | ICD-10-CM | POA: Diagnosis not present

## 2018-04-22 ENCOUNTER — Other Ambulatory Visit: Payer: Self-pay | Admitting: Allergy and Immunology

## 2018-04-28 DIAGNOSIS — Z6830 Body mass index (BMI) 30.0-30.9, adult: Secondary | ICD-10-CM | POA: Diagnosis not present

## 2018-04-28 DIAGNOSIS — J069 Acute upper respiratory infection, unspecified: Secondary | ICD-10-CM | POA: Diagnosis not present

## 2018-06-18 DIAGNOSIS — K069 Disorder of gingiva and edentulous alveolar ridge, unspecified: Secondary | ICD-10-CM | POA: Diagnosis not present

## 2018-06-18 DIAGNOSIS — J069 Acute upper respiratory infection, unspecified: Secondary | ICD-10-CM | POA: Diagnosis not present

## 2018-06-28 DIAGNOSIS — U071 COVID-19: Secondary | ICD-10-CM

## 2018-06-28 HISTORY — DX: COVID-19: U07.1

## 2018-07-25 DIAGNOSIS — H35033 Hypertensive retinopathy, bilateral: Secondary | ICD-10-CM | POA: Diagnosis not present

## 2018-07-25 DIAGNOSIS — H35372 Puckering of macula, left eye: Secondary | ICD-10-CM | POA: Diagnosis not present

## 2018-07-25 DIAGNOSIS — H40013 Open angle with borderline findings, low risk, bilateral: Secondary | ICD-10-CM | POA: Diagnosis not present

## 2018-07-25 DIAGNOSIS — H35371 Puckering of macula, right eye: Secondary | ICD-10-CM | POA: Diagnosis not present

## 2018-08-03 DIAGNOSIS — S29019A Strain of muscle and tendon of unspecified wall of thorax, initial encounter: Secondary | ICD-10-CM | POA: Diagnosis not present

## 2018-08-03 DIAGNOSIS — M5136 Other intervertebral disc degeneration, lumbar region: Secondary | ICD-10-CM | POA: Diagnosis not present

## 2018-08-03 DIAGNOSIS — M9902 Segmental and somatic dysfunction of thoracic region: Secondary | ICD-10-CM | POA: Diagnosis not present

## 2018-08-03 DIAGNOSIS — M9903 Segmental and somatic dysfunction of lumbar region: Secondary | ICD-10-CM | POA: Diagnosis not present

## 2018-08-03 DIAGNOSIS — M9905 Segmental and somatic dysfunction of pelvic region: Secondary | ICD-10-CM | POA: Diagnosis not present

## 2018-08-03 DIAGNOSIS — M5385 Other specified dorsopathies, thoracolumbar region: Secondary | ICD-10-CM | POA: Diagnosis not present

## 2018-08-07 DIAGNOSIS — M5136 Other intervertebral disc degeneration, lumbar region: Secondary | ICD-10-CM | POA: Diagnosis not present

## 2018-08-07 DIAGNOSIS — M9903 Segmental and somatic dysfunction of lumbar region: Secondary | ICD-10-CM | POA: Diagnosis not present

## 2018-08-07 DIAGNOSIS — M9902 Segmental and somatic dysfunction of thoracic region: Secondary | ICD-10-CM | POA: Diagnosis not present

## 2018-08-07 DIAGNOSIS — M5385 Other specified dorsopathies, thoracolumbar region: Secondary | ICD-10-CM | POA: Diagnosis not present

## 2018-08-07 DIAGNOSIS — M9905 Segmental and somatic dysfunction of pelvic region: Secondary | ICD-10-CM | POA: Diagnosis not present

## 2018-08-07 DIAGNOSIS — S29019A Strain of muscle and tendon of unspecified wall of thorax, initial encounter: Secondary | ICD-10-CM | POA: Diagnosis not present

## 2018-08-09 DIAGNOSIS — M9902 Segmental and somatic dysfunction of thoracic region: Secondary | ICD-10-CM | POA: Diagnosis not present

## 2018-08-09 DIAGNOSIS — M9903 Segmental and somatic dysfunction of lumbar region: Secondary | ICD-10-CM | POA: Diagnosis not present

## 2018-08-09 DIAGNOSIS — M5385 Other specified dorsopathies, thoracolumbar region: Secondary | ICD-10-CM | POA: Diagnosis not present

## 2018-08-09 DIAGNOSIS — M5136 Other intervertebral disc degeneration, lumbar region: Secondary | ICD-10-CM | POA: Diagnosis not present

## 2018-08-09 DIAGNOSIS — M9905 Segmental and somatic dysfunction of pelvic region: Secondary | ICD-10-CM | POA: Diagnosis not present

## 2018-08-09 DIAGNOSIS — S29019A Strain of muscle and tendon of unspecified wall of thorax, initial encounter: Secondary | ICD-10-CM | POA: Diagnosis not present

## 2018-08-16 DIAGNOSIS — M546 Pain in thoracic spine: Secondary | ICD-10-CM | POA: Diagnosis not present

## 2018-08-24 DIAGNOSIS — M47814 Spondylosis without myelopathy or radiculopathy, thoracic region: Secondary | ICD-10-CM | POA: Diagnosis not present

## 2018-08-24 DIAGNOSIS — M4804 Spinal stenosis, thoracic region: Secondary | ICD-10-CM | POA: Diagnosis not present

## 2018-08-24 DIAGNOSIS — M546 Pain in thoracic spine: Secondary | ICD-10-CM | POA: Diagnosis not present

## 2018-08-24 DIAGNOSIS — M5124 Other intervertebral disc displacement, thoracic region: Secondary | ICD-10-CM | POA: Diagnosis not present

## 2018-09-06 DIAGNOSIS — M546 Pain in thoracic spine: Secondary | ICD-10-CM | POA: Diagnosis not present

## 2018-10-19 ENCOUNTER — Ambulatory Visit: Payer: Medicare Other | Admitting: Allergy and Immunology

## 2018-11-01 DIAGNOSIS — J069 Acute upper respiratory infection, unspecified: Secondary | ICD-10-CM | POA: Diagnosis not present

## 2018-11-01 DIAGNOSIS — J309 Allergic rhinitis, unspecified: Secondary | ICD-10-CM | POA: Diagnosis not present

## 2018-11-01 DIAGNOSIS — J22 Unspecified acute lower respiratory infection: Secondary | ICD-10-CM | POA: Diagnosis not present

## 2018-11-10 DIAGNOSIS — Z79899 Other long term (current) drug therapy: Secondary | ICD-10-CM | POA: Diagnosis not present

## 2018-11-10 DIAGNOSIS — Z6832 Body mass index (BMI) 32.0-32.9, adult: Secondary | ICD-10-CM | POA: Diagnosis not present

## 2018-11-10 DIAGNOSIS — J320 Chronic maxillary sinusitis: Secondary | ICD-10-CM | POA: Diagnosis not present

## 2018-11-10 DIAGNOSIS — J069 Acute upper respiratory infection, unspecified: Secondary | ICD-10-CM | POA: Diagnosis not present

## 2018-11-10 DIAGNOSIS — E039 Hypothyroidism, unspecified: Secondary | ICD-10-CM | POA: Diagnosis not present

## 2018-11-24 ENCOUNTER — Other Ambulatory Visit: Payer: Self-pay | Admitting: Allergy and Immunology

## 2018-11-30 DIAGNOSIS — K219 Gastro-esophageal reflux disease without esophagitis: Secondary | ICD-10-CM | POA: Diagnosis not present

## 2018-11-30 DIAGNOSIS — R197 Diarrhea, unspecified: Secondary | ICD-10-CM | POA: Diagnosis not present

## 2018-11-30 DIAGNOSIS — R1032 Left lower quadrant pain: Secondary | ICD-10-CM | POA: Diagnosis not present

## 2018-12-06 DIAGNOSIS — K219 Gastro-esophageal reflux disease without esophagitis: Secondary | ICD-10-CM | POA: Diagnosis not present

## 2018-12-06 DIAGNOSIS — R12 Heartburn: Secondary | ICD-10-CM | POA: Diagnosis not present

## 2018-12-06 DIAGNOSIS — K317 Polyp of stomach and duodenum: Secondary | ICD-10-CM | POA: Diagnosis not present

## 2018-12-12 DIAGNOSIS — Z01419 Encounter for gynecological examination (general) (routine) without abnormal findings: Secondary | ICD-10-CM | POA: Diagnosis not present

## 2018-12-12 DIAGNOSIS — Z6834 Body mass index (BMI) 34.0-34.9, adult: Secondary | ICD-10-CM | POA: Diagnosis not present

## 2018-12-12 DIAGNOSIS — N959 Unspecified menopausal and perimenopausal disorder: Secondary | ICD-10-CM | POA: Diagnosis not present

## 2018-12-14 ENCOUNTER — Encounter (INDEPENDENT_AMBULATORY_CARE_PROVIDER_SITE_OTHER): Payer: Medicare Other | Admitting: Ophthalmology

## 2018-12-26 ENCOUNTER — Other Ambulatory Visit: Payer: Self-pay | Admitting: Allergy and Immunology

## 2018-12-28 DIAGNOSIS — R197 Diarrhea, unspecified: Secondary | ICD-10-CM | POA: Diagnosis not present

## 2019-01-04 DIAGNOSIS — K589 Irritable bowel syndrome without diarrhea: Secondary | ICD-10-CM | POA: Diagnosis not present

## 2019-01-04 DIAGNOSIS — K219 Gastro-esophageal reflux disease without esophagitis: Secondary | ICD-10-CM | POA: Diagnosis not present

## 2019-01-22 ENCOUNTER — Other Ambulatory Visit: Payer: Self-pay | Admitting: Obstetrics and Gynecology

## 2019-01-22 DIAGNOSIS — Z1231 Encounter for screening mammogram for malignant neoplasm of breast: Secondary | ICD-10-CM

## 2019-01-23 DIAGNOSIS — H1840 Unspecified corneal degeneration: Secondary | ICD-10-CM | POA: Diagnosis not present

## 2019-01-23 DIAGNOSIS — H40013 Open angle with borderline findings, low risk, bilateral: Secondary | ICD-10-CM | POA: Diagnosis not present

## 2019-01-23 DIAGNOSIS — H04123 Dry eye syndrome of bilateral lacrimal glands: Secondary | ICD-10-CM | POA: Diagnosis not present

## 2019-01-25 ENCOUNTER — Other Ambulatory Visit: Payer: Self-pay

## 2019-01-25 ENCOUNTER — Ambulatory Visit (INDEPENDENT_AMBULATORY_CARE_PROVIDER_SITE_OTHER): Payer: Medicare Other | Admitting: Allergy and Immunology

## 2019-01-25 ENCOUNTER — Encounter: Payer: Self-pay | Admitting: Allergy and Immunology

## 2019-01-25 VITALS — BP 136/90 | HR 105 | Temp 98.2°F | Resp 18 | Ht 60.0 in | Wt 173.0 lb

## 2019-01-25 DIAGNOSIS — K219 Gastro-esophageal reflux disease without esophagitis: Secondary | ICD-10-CM

## 2019-01-25 DIAGNOSIS — J3089 Other allergic rhinitis: Secondary | ICD-10-CM

## 2019-01-25 DIAGNOSIS — J4520 Mild intermittent asthma, uncomplicated: Secondary | ICD-10-CM | POA: Diagnosis not present

## 2019-01-25 NOTE — Patient Instructions (Addendum)
  1. Continue Protonix 40 mg twice a day  2. If needed:   A. Allegra 180 one tablet once a day  B. ProAir HFA 2 inhalations every 4-6 hours  C. Delsym   3. Return to clinic in 12 months or earlier if problem  4. Obtain fall flu vaccine (and COVID vaccine)

## 2019-01-25 NOTE — Progress Notes (Signed)
[facility]   Follow-up Note  Referring Provider: Mateo Flow, MD  Primary Provider: Mateo Flow, MD Date of Office Visit: 01/25/2019  Subjective:   [name] (DOB: [birth year]) is a 73 y.o. female who returns to the [facility] [place] [facility] on 01/25/2019 in re-evaluation of the following:  HPI: [name] presents to this clinic in evaluation of LPR and allergic rhinitis and a very distant history of asthma.  Her last visit to this clinic was 05 April 2018.  Overall she thinks that her throat is doing well and her coughing is really doing quite well.  She has apparently had manipulation of her antireflux medications by Dr. Melina Copa who performed an upper endoscopy last month.  It sounds as though her Prevacid which she utilized twice a day was converted over to Protonix twice a day. Overall she believes that her reflux is under pretty good control at this point in time.  She does not use a controller agent for asthma and she does not use a short acting bronchodilator.  It does not sound as though she has required a systemic steroid or an antibiotic for any type of airway issue.  However, at the tail end of January or possibly February 2020 she apparently had a prolonged fever of 104 without any respiratory tract symptoms and without any GI symptoms which spontaneously resolved.  Allergies as of 01/25/2019      Reactions   Mercury    Morphine And Related    Pt does not remember      Medication List      albuterol 108 (90 Base) MCG/ACT inhaler Commonly known as: VENTOLIN HFA Inhale two puffs every four to six hours as needed for cough or wheeze.   benzonatate 100 MG capsule Commonly known as: TESSALON Take 1-2 capsules every 8 hours as needed for cough   buPROPion 150 MG 12 hr tablet Commonly known as: WELLBUTRIN SR Take 150 mg by mouth daily.   calcium carbonate 1500 (600 Ca) MG Tabs tablet Commonly known as: OSCAL Take  1,500 mg by mouth 2 (two) times daily with a meal.   CENTRUM SILVER ADULT 50+ PO Take 1 tablet by mouth daily.   citalopram 20 MG tablet Commonly known as: CELEXA   cycloSPORINE 0.05 % ophthalmic emulsion Commonly known as: RESTASIS 1 drop 2 (two) times daily.   Delsym 30 MG/5ML liquid Generic drug: dextromethorphan Take 30 mg by mouth at bedtime as needed for cough.   dicyclomine 10 MG capsule Commonly known as: BENTYL Take 10 mg by mouth 3 (three) times daily as needed. Abdominal spasms   estrogens (conjugated) 0.3 MG tablet Commonly known as: PREMARIN Take 0.3 mg by mouth daily. Take daily for 21 days then do not take for 7 days.   fexofenadine 180 MG tablet Commonly known as: ALLEGRA Take 180 mg by mouth every evening.   lansoprazole 30 MG capsule Commonly known as: PREVACID TAKE ONE CAPSULE BY MOUTH TWICE A DAY BEFORE MEALS   levothyroxine 50 MCG tablet Commonly known as: SYNTHROID Take 50 mcg by mouth daily.   montelukast 10 MG tablet Commonly known as: SINGULAIR TAKE ONE (1) TABLET AT BEDTIME   ranitidine 300 MG tablet Commonly known as: ZANTAC TAKE ONE TABLET DAILY AT BEDTIME   triamterene-hydrochlorothiazide 37.5-25 MG capsule Commonly known as: DYAZIDE Take 1 capsule by mouth daily.   zolpidem 10 MG tablet Commonly known as: AMBIEN Take 10 mg  by mouth as needed for sleep.       Past Medical History:  Diagnosis Date  . Allergic rhinitis   . Asthma   . Complication of anesthesia    Pt stated that anesthesia was not effective in previous cataract surgery. Pt stated " I could feel what they were doing; pt stated that during an Epidural i tought my legs were up but they were down"  . GERD (gastroesophageal reflux disease)   . History of hiatal hernia   . History of kidney stones   . Hyperlipidemia   . Macular hole of left eye   . Preretinal fibrosis, left eye   . Shingles outbreak     Past Surgical History:  Procedure Laterality Date  .  APPENDECTOMY    . CATARACT EXTRACTION W/ INTRAOCULAR LENS  IMPLANT, BILATERAL    . CHOLECYSTECTOMY    . COLONOSCOPY    . MEDIAL PARTIAL KNEE REPLACEMENT     left  . TUBAL LIGATION    . VAGINAL HYSTERECTOMY      Review of systems negative except as noted in HPI / PMHx or noted below:  Review of Systems  Constitutional: Negative.   HENT: Negative.   Eyes: Negative.   Respiratory: Negative.   Cardiovascular: Negative.   Gastrointestinal: Negative.   Genitourinary: Negative.   Musculoskeletal: Negative.   Skin: Negative.   Neurological: Negative.   Endo/Heme/Allergies: Negative.   Psychiatric/Behavioral: Negative.      Objective:   Vitals:   01/25/19 1520  BP: 136/90  Pulse: (!) 105  Resp: 18  Temp: 98.2 F (36.8 C)  SpO2: 95%   Height: 5' (152.4 cm)  Weight: 173 lb (78.5 kg)   Physical Exam Constitutional:      Appearance: She is not diaphoretic.  HENT:     Head: Normocephalic.     Right Ear: Tympanic membrane, ear canal and external ear normal.     Left Ear: Tympanic membrane, ear canal and external ear normal.     Nose: Nose normal. No mucosal edema or rhinorrhea.     Mouth/Throat:     Pharynx: Uvula midline. No oropharyngeal exudate.  Eyes:     Conjunctiva/sclera: Conjunctivae normal.  Neck:     Thyroid: No thyromegaly.     Trachea: Trachea normal. No tracheal tenderness or tracheal deviation.  Cardiovascular:     Rate and Rhythm: Normal rate and regular rhythm.     Heart sounds: Normal heart sounds, S1 normal and S2 normal. No murmur.  Pulmonary:     Effort: No respiratory distress.     Breath sounds: Normal breath sounds. No stridor. No wheezing or rales.  Lymphadenopathy:     Head:     Right side of head: No tonsillar adenopathy.     Left side of head: No tonsillar adenopathy.     Cervical: No cervical adenopathy.  Skin:    Findings: No erythema or rash.     Nails: There is no clubbing.   Neurological:     Mental Status: She is alert.      Diagnostics: none  Assessment and Plan:   1. Asthma, mild intermittent, well-controlled   2. Other allergic rhinitis   3. LPRD (laryngopharyngeal reflux disease)     1. Continue Protonix 40 mg twice a day  2. If needed:   A. Allegra 180 one tablet once a day  B. ProAir HFA 2 inhalations every 4-6 hours  C. Delsym   3. Return to clinic in 12 months  or earlier if problem  4. Obtain fall flu vaccine (and COVID vaccine)  Shareen appears to be doing quite well with minimal amounts of medications regarding her respiratory tract issue.  I have informed Pedro that we actually do not need to see her back in this clinic if she continues to do well and she can get all her medications refilled by her primary care doctor or gastroenterologist.  We will certainly be available should there be a significant problem that develops in the future.  Allena Katz, MD Allergy / Immunology [place]

## 2019-01-29 ENCOUNTER — Encounter: Payer: Self-pay | Admitting: Allergy and Immunology

## 2019-02-06 DIAGNOSIS — K219 Gastro-esophageal reflux disease without esophagitis: Secondary | ICD-10-CM | POA: Diagnosis not present

## 2019-03-08 ENCOUNTER — Other Ambulatory Visit: Payer: Self-pay

## 2019-03-08 ENCOUNTER — Ambulatory Visit
Admission: RE | Admit: 2019-03-08 | Discharge: 2019-03-08 | Disposition: A | Discharge status: Home / Self Care | Payer: Medicare Other | Source: Ambulatory Visit | Attending: Obstetrics and Gynecology | Admitting: Obstetrics and Gynecology

## 2019-03-08 DIAGNOSIS — Z1231 Encounter for screening mammogram for malignant neoplasm of breast: Secondary | ICD-10-CM

## 2019-03-16 DIAGNOSIS — L578 Other skin changes due to chronic exposure to nonionizing radiation: Secondary | ICD-10-CM | POA: Diagnosis not present

## 2019-03-16 DIAGNOSIS — B079 Viral wart, unspecified: Secondary | ICD-10-CM | POA: Diagnosis not present

## 2019-03-16 DIAGNOSIS — L820 Inflamed seborrheic keratosis: Secondary | ICD-10-CM | POA: Diagnosis not present

## 2019-03-16 DIAGNOSIS — L821 Other seborrheic keratosis: Secondary | ICD-10-CM | POA: Diagnosis not present

## 2019-03-16 DIAGNOSIS — C44722 Squamous cell carcinoma of skin of right lower limb, including hip: Secondary | ICD-10-CM | POA: Diagnosis not present

## 2019-04-02 DIAGNOSIS — D0471 Carcinoma in situ of skin of right lower limb, including hip: Secondary | ICD-10-CM | POA: Diagnosis not present

## 2019-04-11 ENCOUNTER — Encounter (INDEPENDENT_AMBULATORY_CARE_PROVIDER_SITE_OTHER): Payer: Medicare Other | Admitting: Ophthalmology

## 2019-04-18 ENCOUNTER — Encounter (INDEPENDENT_AMBULATORY_CARE_PROVIDER_SITE_OTHER): Payer: Medicare Other | Admitting: Ophthalmology

## 2019-05-04 DIAGNOSIS — L97909 Non-pressure chronic ulcer of unspecified part of unspecified lower leg with unspecified severity: Secondary | ICD-10-CM | POA: Diagnosis not present

## 2019-05-09 ENCOUNTER — Encounter (INDEPENDENT_AMBULATORY_CARE_PROVIDER_SITE_OTHER): Payer: Medicare Other | Admitting: Ophthalmology

## 2019-05-09 DIAGNOSIS — I10 Essential (primary) hypertension: Secondary | ICD-10-CM

## 2019-05-09 DIAGNOSIS — H35033 Hypertensive retinopathy, bilateral: Secondary | ICD-10-CM

## 2019-05-09 DIAGNOSIS — H35373 Puckering of macula, bilateral: Secondary | ICD-10-CM

## 2019-05-09 DIAGNOSIS — H43813 Vitreous degeneration, bilateral: Secondary | ICD-10-CM | POA: Diagnosis not present

## 2019-05-14 ENCOUNTER — Other Ambulatory Visit: Payer: Self-pay | Admitting: Neurosurgery

## 2019-05-14 DIAGNOSIS — M48061 Spinal stenosis, lumbar region without neurogenic claudication: Secondary | ICD-10-CM

## 2019-05-17 ENCOUNTER — Ambulatory Visit
Admission: RE | Admit: 2019-05-17 | Discharge: 2019-05-17 | Disposition: A | Discharge status: Home / Self Care | Payer: Medicare Other | Source: Ambulatory Visit | Attending: Neurosurgery | Admitting: Neurosurgery

## 2019-05-17 ENCOUNTER — Other Ambulatory Visit: Payer: Self-pay

## 2019-05-17 DIAGNOSIS — M545 Low back pain: Secondary | ICD-10-CM | POA: Diagnosis not present

## 2019-05-17 DIAGNOSIS — M48061 Spinal stenosis, lumbar region without neurogenic claudication: Secondary | ICD-10-CM

## 2019-05-17 MED ORDER — METHYLPREDNISOLONE ACETATE 40 MG/ML INJ SUSP (RADIOLOG: 120.0000 mg | Freq: Once | INTRAMUSCULAR | Status: DC

## 2019-05-17 MED ORDER — IOPAMIDOL (ISOVUE-M 200) INJECTION 41%: 1.0000 mL | Freq: Once | INTRAMUSCULAR | Status: DC

## 2019-05-17 NOTE — Discharge Instructions (Signed)

## 2019-06-29 DIAGNOSIS — R197 Diarrhea, unspecified: Secondary | ICD-10-CM | POA: Diagnosis not present

## 2019-06-29 DIAGNOSIS — R109 Unspecified abdominal pain: Secondary | ICD-10-CM | POA: Diagnosis not present

## 2019-06-29 DIAGNOSIS — R231 Pallor: Secondary | ICD-10-CM | POA: Diagnosis not present

## 2019-06-29 DIAGNOSIS — R531 Weakness: Secondary | ICD-10-CM | POA: Diagnosis not present

## 2019-06-29 DIAGNOSIS — R7989 Other specified abnormal findings of blood chemistry: Secondary | ICD-10-CM | POA: Diagnosis not present

## 2019-06-29 DIAGNOSIS — R1110 Vomiting, unspecified: Secondary | ICD-10-CM | POA: Diagnosis not present

## 2019-06-29 DIAGNOSIS — D72829 Elevated white blood cell count, unspecified: Secondary | ICD-10-CM | POA: Diagnosis not present

## 2019-06-29 DIAGNOSIS — R42 Dizziness and giddiness: Secondary | ICD-10-CM | POA: Diagnosis not present

## 2019-06-29 DIAGNOSIS — Z03818 Encounter for observation for suspected exposure to other biological agents ruled out: Secondary | ICD-10-CM | POA: Diagnosis not present

## 2019-06-29 DIAGNOSIS — N179 Acute kidney failure, unspecified: Secondary | ICD-10-CM | POA: Diagnosis not present

## 2019-06-29 DIAGNOSIS — E860 Dehydration: Secondary | ICD-10-CM | POA: Diagnosis not present

## 2019-06-29 DIAGNOSIS — R1111 Vomiting without nausea: Secondary | ICD-10-CM | POA: Diagnosis not present

## 2019-06-29 DIAGNOSIS — R112 Nausea with vomiting, unspecified: Secondary | ICD-10-CM | POA: Diagnosis not present

## 2019-06-29 DIAGNOSIS — R110 Nausea: Secondary | ICD-10-CM | POA: Diagnosis not present

## 2019-06-29 DIAGNOSIS — E039 Hypothyroidism, unspecified: Secondary | ICD-10-CM | POA: Diagnosis not present

## 2019-06-30 DIAGNOSIS — E860 Dehydration: Secondary | ICD-10-CM | POA: Diagnosis not present

## 2019-06-30 DIAGNOSIS — R7989 Other specified abnormal findings of blood chemistry: Secondary | ICD-10-CM | POA: Diagnosis not present

## 2019-06-30 DIAGNOSIS — R112 Nausea with vomiting, unspecified: Secondary | ICD-10-CM | POA: Diagnosis not present

## 2019-06-30 DIAGNOSIS — R197 Diarrhea, unspecified: Secondary | ICD-10-CM | POA: Diagnosis not present

## 2019-06-30 DIAGNOSIS — E039 Hypothyroidism, unspecified: Secondary | ICD-10-CM | POA: Diagnosis not present

## 2019-06-30 DIAGNOSIS — R109 Unspecified abdominal pain: Secondary | ICD-10-CM | POA: Diagnosis not present

## 2019-06-30 DIAGNOSIS — N179 Acute kidney failure, unspecified: Secondary | ICD-10-CM | POA: Diagnosis not present

## 2019-06-30 DIAGNOSIS — D72829 Elevated white blood cell count, unspecified: Secondary | ICD-10-CM | POA: Diagnosis not present

## 2019-07-01 DIAGNOSIS — D72829 Elevated white blood cell count, unspecified: Secondary | ICD-10-CM | POA: Diagnosis not present

## 2019-07-01 DIAGNOSIS — E860 Dehydration: Secondary | ICD-10-CM | POA: Diagnosis not present

## 2019-07-01 DIAGNOSIS — R7989 Other specified abnormal findings of blood chemistry: Secondary | ICD-10-CM | POA: Diagnosis not present

## 2019-07-01 DIAGNOSIS — R109 Unspecified abdominal pain: Secondary | ICD-10-CM | POA: Diagnosis not present

## 2019-07-01 DIAGNOSIS — E039 Hypothyroidism, unspecified: Secondary | ICD-10-CM | POA: Diagnosis not present

## 2019-07-01 DIAGNOSIS — R112 Nausea with vomiting, unspecified: Secondary | ICD-10-CM | POA: Diagnosis not present

## 2019-07-01 DIAGNOSIS — N179 Acute kidney failure, unspecified: Secondary | ICD-10-CM | POA: Diagnosis not present

## 2019-07-01 DIAGNOSIS — R197 Diarrhea, unspecified: Secondary | ICD-10-CM | POA: Diagnosis not present

## 2019-07-02 DIAGNOSIS — D72829 Elevated white blood cell count, unspecified: Secondary | ICD-10-CM | POA: Diagnosis not present

## 2019-07-02 DIAGNOSIS — R112 Nausea with vomiting, unspecified: Secondary | ICD-10-CM | POA: Diagnosis not present

## 2019-07-02 DIAGNOSIS — E860 Dehydration: Secondary | ICD-10-CM | POA: Diagnosis not present

## 2019-07-02 DIAGNOSIS — R197 Diarrhea, unspecified: Secondary | ICD-10-CM | POA: Diagnosis not present

## 2019-07-02 DIAGNOSIS — N179 Acute kidney failure, unspecified: Secondary | ICD-10-CM | POA: Diagnosis not present

## 2019-07-02 DIAGNOSIS — R109 Unspecified abdominal pain: Secondary | ICD-10-CM | POA: Diagnosis not present

## 2019-07-02 DIAGNOSIS — R7989 Other specified abnormal findings of blood chemistry: Secondary | ICD-10-CM | POA: Diagnosis not present

## 2019-07-02 DIAGNOSIS — E039 Hypothyroidism, unspecified: Secondary | ICD-10-CM | POA: Diagnosis not present

## 2019-07-03 DIAGNOSIS — R197 Diarrhea, unspecified: Secondary | ICD-10-CM | POA: Diagnosis not present

## 2019-07-03 DIAGNOSIS — R112 Nausea with vomiting, unspecified: Secondary | ICD-10-CM | POA: Diagnosis not present

## 2019-07-03 DIAGNOSIS — R109 Unspecified abdominal pain: Secondary | ICD-10-CM | POA: Diagnosis not present

## 2019-07-03 DIAGNOSIS — R7989 Other specified abnormal findings of blood chemistry: Secondary | ICD-10-CM | POA: Diagnosis not present

## 2019-07-03 DIAGNOSIS — E039 Hypothyroidism, unspecified: Secondary | ICD-10-CM | POA: Diagnosis not present

## 2019-07-03 DIAGNOSIS — N179 Acute kidney failure, unspecified: Secondary | ICD-10-CM | POA: Diagnosis not present

## 2019-07-03 DIAGNOSIS — E860 Dehydration: Secondary | ICD-10-CM | POA: Diagnosis not present

## 2019-07-03 DIAGNOSIS — D72829 Elevated white blood cell count, unspecified: Secondary | ICD-10-CM | POA: Diagnosis not present

## 2019-07-13 DIAGNOSIS — D62 Acute posthemorrhagic anemia: Secondary | ICD-10-CM | POA: Diagnosis not present

## 2019-07-13 DIAGNOSIS — N19 Unspecified kidney failure: Secondary | ICD-10-CM | POA: Diagnosis not present

## 2019-07-13 DIAGNOSIS — A09 Infectious gastroenteritis and colitis, unspecified: Secondary | ICD-10-CM | POA: Diagnosis not present

## 2019-07-13 DIAGNOSIS — Z23 Encounter for immunization: Secondary | ICD-10-CM | POA: Diagnosis not present

## 2019-08-08 DIAGNOSIS — H40013 Open angle with borderline findings, low risk, bilateral: Secondary | ICD-10-CM | POA: Diagnosis not present

## 2019-08-08 DIAGNOSIS — H43813 Vitreous degeneration, bilateral: Secondary | ICD-10-CM | POA: Diagnosis not present

## 2019-08-08 DIAGNOSIS — H52223 Regular astigmatism, bilateral: Secondary | ICD-10-CM | POA: Diagnosis not present

## 2019-08-08 DIAGNOSIS — H5213 Myopia, bilateral: Secondary | ICD-10-CM | POA: Diagnosis not present

## 2019-10-24 DIAGNOSIS — H43813 Vitreous degeneration, bilateral: Secondary | ICD-10-CM | POA: Diagnosis not present

## 2019-10-24 DIAGNOSIS — H524 Presbyopia: Secondary | ICD-10-CM | POA: Diagnosis not present

## 2019-10-24 DIAGNOSIS — H40013 Open angle with borderline findings, low risk, bilateral: Secondary | ICD-10-CM | POA: Diagnosis not present

## 2019-10-24 DIAGNOSIS — H5201 Hypermetropia, right eye: Secondary | ICD-10-CM | POA: Diagnosis not present

## 2019-10-24 DIAGNOSIS — H5212 Myopia, left eye: Secondary | ICD-10-CM | POA: Diagnosis not present

## 2019-10-24 DIAGNOSIS — H52223 Regular astigmatism, bilateral: Secondary | ICD-10-CM | POA: Diagnosis not present

## 2019-11-03 DIAGNOSIS — H6990 Unspecified Eustachian tube disorder, unspecified ear: Secondary | ICD-10-CM | POA: Diagnosis not present

## 2019-11-03 DIAGNOSIS — J0100 Acute maxillary sinusitis, unspecified: Secondary | ICD-10-CM | POA: Diagnosis not present

## 2019-11-03 DIAGNOSIS — H6123 Impacted cerumen, bilateral: Secondary | ICD-10-CM | POA: Diagnosis not present

## 2019-12-27 DIAGNOSIS — Z23 Encounter for immunization: Secondary | ICD-10-CM | POA: Diagnosis not present

## 2020-02-02 DIAGNOSIS — R2241 Localized swelling, mass and lump, right lower limb: Secondary | ICD-10-CM | POA: Diagnosis not present

## 2020-02-04 DIAGNOSIS — R2241 Localized swelling, mass and lump, right lower limb: Secondary | ICD-10-CM | POA: Diagnosis not present

## 2020-02-11 ENCOUNTER — Ambulatory Visit: Payer: Medicare Other | Admitting: Allergy and Immunology

## 2020-05-05 ENCOUNTER — Other Ambulatory Visit: Payer: Self-pay | Admitting: Obstetrics and Gynecology

## 2020-05-05 DIAGNOSIS — Z1231 Encounter for screening mammogram for malignant neoplasm of breast: Secondary | ICD-10-CM

## 2020-05-06 ENCOUNTER — Ambulatory Visit
Admission: RE | Admit: 2020-05-06 | Discharge: 2020-05-06 | Disposition: A | Discharge status: Home / Self Care | Payer: Medicare Other | Source: Ambulatory Visit | Attending: Obstetrics and Gynecology | Admitting: Obstetrics and Gynecology

## 2020-05-06 ENCOUNTER — Other Ambulatory Visit: Payer: Self-pay

## 2020-05-06 DIAGNOSIS — Z1231 Encounter for screening mammogram for malignant neoplasm of breast: Secondary | ICD-10-CM

## 2020-05-12 ENCOUNTER — Encounter (INDEPENDENT_AMBULATORY_CARE_PROVIDER_SITE_OTHER): Payer: Medicare Other | Admitting: Ophthalmology

## 2020-05-12 ENCOUNTER — Other Ambulatory Visit: Payer: Self-pay

## 2020-05-12 DIAGNOSIS — I10 Essential (primary) hypertension: Secondary | ICD-10-CM

## 2020-05-12 DIAGNOSIS — H43813 Vitreous degeneration, bilateral: Secondary | ICD-10-CM

## 2020-05-12 DIAGNOSIS — H35033 Hypertensive retinopathy, bilateral: Secondary | ICD-10-CM | POA: Diagnosis not present

## 2020-05-12 DIAGNOSIS — H35373 Puckering of macula, bilateral: Secondary | ICD-10-CM | POA: Diagnosis not present

## 2020-05-20 DIAGNOSIS — K219 Gastro-esophageal reflux disease without esophagitis: Secondary | ICD-10-CM | POA: Diagnosis not present

## 2020-05-20 DIAGNOSIS — R110 Nausea: Secondary | ICD-10-CM | POA: Diagnosis not present

## 2020-05-20 DIAGNOSIS — R109 Unspecified abdominal pain: Secondary | ICD-10-CM | POA: Diagnosis not present

## 2020-05-20 DIAGNOSIS — R197 Diarrhea, unspecified: Secondary | ICD-10-CM | POA: Diagnosis not present

## 2020-05-27 DIAGNOSIS — Z23 Encounter for immunization: Secondary | ICD-10-CM | POA: Diagnosis not present

## 2020-05-27 DIAGNOSIS — E039 Hypothyroidism, unspecified: Secondary | ICD-10-CM | POA: Diagnosis not present

## 2020-05-27 DIAGNOSIS — I10 Essential (primary) hypertension: Secondary | ICD-10-CM | POA: Diagnosis not present

## 2020-05-27 DIAGNOSIS — E782 Mixed hyperlipidemia: Secondary | ICD-10-CM | POA: Diagnosis not present

## 2020-05-27 DIAGNOSIS — R197 Diarrhea, unspecified: Secondary | ICD-10-CM | POA: Diagnosis not present

## 2020-05-27 DIAGNOSIS — F4321 Adjustment disorder with depressed mood: Secondary | ICD-10-CM | POA: Diagnosis not present

## 2020-05-27 DIAGNOSIS — Z0000 Encounter for general adult medical examination without abnormal findings: Secondary | ICD-10-CM | POA: Diagnosis not present

## 2020-06-10 DIAGNOSIS — I10 Essential (primary) hypertension: Secondary | ICD-10-CM | POA: Insufficient documentation

## 2020-06-10 DIAGNOSIS — Z124 Encounter for screening for malignant neoplasm of cervix: Secondary | ICD-10-CM | POA: Diagnosis not present

## 2020-06-10 DIAGNOSIS — N959 Unspecified menopausal and perimenopausal disorder: Secondary | ICD-10-CM | POA: Diagnosis not present

## 2020-06-10 DIAGNOSIS — N952 Postmenopausal atrophic vaginitis: Secondary | ICD-10-CM | POA: Diagnosis not present

## 2020-06-10 DIAGNOSIS — Z6834 Body mass index (BMI) 34.0-34.9, adult: Secondary | ICD-10-CM | POA: Diagnosis not present

## 2020-06-10 DIAGNOSIS — Z1272 Encounter for screening for malignant neoplasm of vagina: Secondary | ICD-10-CM | POA: Diagnosis not present

## 2020-06-10 HISTORY — DX: Essential (primary) hypertension: I10

## 2020-06-13 DIAGNOSIS — D120 Benign neoplasm of cecum: Secondary | ICD-10-CM | POA: Diagnosis not present

## 2020-06-13 DIAGNOSIS — D125 Benign neoplasm of sigmoid colon: Secondary | ICD-10-CM | POA: Diagnosis not present

## 2020-06-13 DIAGNOSIS — K635 Polyp of colon: Secondary | ICD-10-CM | POA: Diagnosis not present

## 2020-06-13 DIAGNOSIS — R197 Diarrhea, unspecified: Secondary | ICD-10-CM | POA: Diagnosis not present

## 2020-11-11 DIAGNOSIS — M546 Pain in thoracic spine: Secondary | ICD-10-CM | POA: Diagnosis not present

## 2020-11-11 DIAGNOSIS — Z6834 Body mass index (BMI) 34.0-34.9, adult: Secondary | ICD-10-CM | POA: Diagnosis not present

## 2020-11-11 DIAGNOSIS — I10 Essential (primary) hypertension: Secondary | ICD-10-CM | POA: Diagnosis not present

## 2020-11-18 DIAGNOSIS — M47814 Spondylosis without myelopathy or radiculopathy, thoracic region: Secondary | ICD-10-CM | POA: Diagnosis not present

## 2020-11-18 DIAGNOSIS — M546 Pain in thoracic spine: Secondary | ICD-10-CM | POA: Diagnosis not present

## 2020-11-18 DIAGNOSIS — M419 Scoliosis, unspecified: Secondary | ICD-10-CM | POA: Diagnosis not present

## 2020-11-18 DIAGNOSIS — M2578 Osteophyte, vertebrae: Secondary | ICD-10-CM | POA: Diagnosis not present

## 2020-12-04 DIAGNOSIS — M48062 Spinal stenosis, lumbar region with neurogenic claudication: Secondary | ICD-10-CM | POA: Diagnosis not present

## 2020-12-04 DIAGNOSIS — M546 Pain in thoracic spine: Secondary | ICD-10-CM | POA: Diagnosis not present

## 2020-12-12 DIAGNOSIS — M48062 Spinal stenosis, lumbar region with neurogenic claudication: Secondary | ICD-10-CM | POA: Diagnosis not present

## 2020-12-12 DIAGNOSIS — M47814 Spondylosis without myelopathy or radiculopathy, thoracic region: Secondary | ICD-10-CM | POA: Diagnosis not present

## 2020-12-12 DIAGNOSIS — M546 Pain in thoracic spine: Secondary | ICD-10-CM | POA: Diagnosis not present

## 2020-12-23 DIAGNOSIS — M48062 Spinal stenosis, lumbar region with neurogenic claudication: Secondary | ICD-10-CM | POA: Diagnosis not present

## 2021-01-03 DIAGNOSIS — L432 Lichenoid drug reaction: Secondary | ICD-10-CM | POA: Diagnosis not present

## 2021-01-06 DIAGNOSIS — R000 Tachycardia, unspecified: Secondary | ICD-10-CM | POA: Diagnosis not present

## 2021-01-06 DIAGNOSIS — Z6833 Body mass index (BMI) 33.0-33.9, adult: Secondary | ICD-10-CM | POA: Diagnosis not present

## 2021-01-06 DIAGNOSIS — Z79899 Other long term (current) drug therapy: Secondary | ICD-10-CM | POA: Diagnosis not present

## 2021-01-06 DIAGNOSIS — E039 Hypothyroidism, unspecified: Secondary | ICD-10-CM | POA: Diagnosis not present

## 2021-01-23 DIAGNOSIS — R000 Tachycardia, unspecified: Secondary | ICD-10-CM | POA: Diagnosis not present

## 2021-03-05 DIAGNOSIS — M48062 Spinal stenosis, lumbar region with neurogenic claudication: Secondary | ICD-10-CM | POA: Diagnosis not present

## 2021-03-05 DIAGNOSIS — M48061 Spinal stenosis, lumbar region without neurogenic claudication: Secondary | ICD-10-CM | POA: Diagnosis not present

## 2021-03-05 DIAGNOSIS — Z6833 Body mass index (BMI) 33.0-33.9, adult: Secondary | ICD-10-CM | POA: Diagnosis not present

## 2021-03-05 DIAGNOSIS — G2581 Restless legs syndrome: Secondary | ICD-10-CM | POA: Diagnosis not present

## 2021-03-10 DIAGNOSIS — H43813 Vitreous degeneration, bilateral: Secondary | ICD-10-CM | POA: Diagnosis not present

## 2021-03-10 DIAGNOSIS — H40023 Open angle with borderline findings, high risk, bilateral: Secondary | ICD-10-CM | POA: Diagnosis not present

## 2021-03-26 ENCOUNTER — Encounter: Payer: Self-pay | Admitting: *Deleted

## 2021-03-26 ENCOUNTER — Encounter: Payer: Self-pay | Admitting: Cardiology

## 2021-03-26 DIAGNOSIS — M48062 Spinal stenosis, lumbar region with neurogenic claudication: Secondary | ICD-10-CM | POA: Diagnosis not present

## 2021-03-31 DIAGNOSIS — D6851 Activated protein C resistance: Secondary | ICD-10-CM | POA: Insufficient documentation

## 2021-03-31 DIAGNOSIS — Z87442 Personal history of urinary calculi: Secondary | ICD-10-CM | POA: Insufficient documentation

## 2021-03-31 DIAGNOSIS — F4321 Adjustment disorder with depressed mood: Secondary | ICD-10-CM | POA: Insufficient documentation

## 2021-03-31 DIAGNOSIS — E039 Hypothyroidism, unspecified: Secondary | ICD-10-CM | POA: Insufficient documentation

## 2021-03-31 DIAGNOSIS — Z8719 Personal history of other diseases of the digestive system: Secondary | ICD-10-CM | POA: Insufficient documentation

## 2021-03-31 DIAGNOSIS — T8859XA Other complications of anesthesia, initial encounter: Secondary | ICD-10-CM | POA: Insufficient documentation

## 2021-03-31 DIAGNOSIS — G43909 Migraine, unspecified, not intractable, without status migrainosus: Secondary | ICD-10-CM | POA: Insufficient documentation

## 2021-03-31 DIAGNOSIS — B029 Zoster without complications: Secondary | ICD-10-CM | POA: Insufficient documentation

## 2021-03-31 DIAGNOSIS — E785 Hyperlipidemia, unspecified: Secondary | ICD-10-CM | POA: Insufficient documentation

## 2021-03-31 DIAGNOSIS — H35342 Macular cyst, hole, or pseudohole, left eye: Secondary | ICD-10-CM | POA: Insufficient documentation

## 2021-03-31 DIAGNOSIS — G4700 Insomnia, unspecified: Secondary | ICD-10-CM | POA: Insufficient documentation

## 2021-04-13 NOTE — Progress Notes (Signed)
Told him to Cardiology Office Note:    Date:  04/14/2021   ID:  [name], DOB 09-Oct-1945, MRN [id number]  PCP:  Mateo Flow, MD  Cardiologist:  Shirlee More, MD   Referring MD: Mateo Flow, MD  ASSESSMENT:    1. Shortness of breath   2. Primary hypertension   3. Palpitations    PLAN:    In order of problems listed above:  After seeing her in the office with her primary problem is shortness of breath that is progressive quite limited no obvious cardiac or pulmonary disease.  For further evaluation I asked her to do a proBNP level on the presence or absence of heart failure cardiac echocardiogram if these are normal I think she should undergo formal evaluation including PFTs full and high-resolution CT chest. Presently on no antihypertensive agents Will get the monitor symptoms are associated with sinus rhythm sinus tachycardia she takes Bentyl that can raise your heart rate asked her to stop and we will switch from [place] which can be proarrhythmic to either Ceretec or Claritin over-the-counter  Next appointment 6 to 8 weeks   Medication Adjustments/Labs and Tests Ordered: Current medicines are reviewed at length with the patient today.  Concerns regarding medicines are outlined above.  Orders Placed This Encounter  Procedures   Pro b natriuretic peptide (BNP)   EKG 12-Lead   ECHOCARDIOGRAM COMPLETE   No orders of the defined types were placed in this encounter.   Chief complaint, I will monitor and was asked to see cardiology  History of Present Illness:    [name] is a 75 y.o. female who is being seen today for the evaluation of rapid heartbeat at the request of Mateo Flow, MD. her med list no dental and anticholinergic agent twice daily which can cause rapid heart rate.  She also takes antihistamine Allegra.    EKG 02/23/2021 sinus rhythm and normal  She had an event monitor without primary care physician showing sinus rhythm rare ventricular and  supraventricular ectopy 2 brief episodes of atrial tachycardia the longest 13 complexes rate 147 bpm.  Her symptomatic events were sinus rhythm and sinus tachycardia.  Referral was for rapid heart rhythm and reviewed the ZIO monitor and there is no specific abnormality or unusual rhythm. For many years she has been short of breath is been seen by Dr. Neldon Mc allergy and asthma She has no known history of other lung disease non-smoker no occupational exposures. She has no known history of heart disease congenital rheumatic or heart murmur. For shortness of breath is worsened recently she rarely goes out of the home because she finds her self so breathless when she does activities. Indoors at times even with ADLs and walking room to room shot shortness of breath diaphoresis her heart races and at times she feels like she is going to faint. She has no known history of venous thromboembolism. She takes 2 medications that can affect the heart rhythm rental which can cause rapid rate anticholinergic and Allegra. I asked her to switch to either Zyrtec or Claritin and try to stop Bentyl. She does not think she is ever had a cardiac evaluation has had no recent imaging.  Laboratory test 05/27/2020 shows a hemoglobin of 12.6 creatinine 1.4 potassium 4.0 TSH 3.9 5 July this year Past Medical History:  Diagnosis Date   Adjustment disorder with depressed mood    Allergic rhinitis    Asthma    Complication of anesthesia  Pt stated that anesthesia was not effective in previous cataract surgery. Pt stated " I could feel what they were doing; pt stated that during an Epidural i tought my legs were up but they were down"   Cough 10/28/2015   NO  10/29/2015      = 28  - Spirometry 10/28/2015  wnl including fef 25-75   - Trial off asthmanex 10/28/2015 > breathing improved/ only throat clearing remained 11/19/2015  - Sinus Ct 11/17/2015 > Paranasal sinuses are clear. - CT chest 11/17/2015 > No evidence of interstitial  lung disease. No findings to explain the patient's cough - rechallenge 11/19/2015 with 1st gen h1  > then gabapentin 100 tid      Dyspnea 10/29/2015   10/28/2015  Walked RA x 3 laps @ 185 ft each stopped due to  End of study, nl pace, no sob or desat    - spirometry 10/28/2015 also wnl    GERD (gastroesophageal reflux disease)    Hallux limitus of left foot 04/21/2017   Heterozygous factor V Leiden mutation ([place])    History of hiatal hernia    History of kidney stones    Hyperlipidemia    Hypertensive disorder 06/10/2020   Hypothyroidism    Ingrown nail 04/21/2017   Insomnia    LPRD (laryngopharyngeal reflux disease) 03/10/2015   Macular hole of left eye    Migraine headache    Preretinal fibrosis, left 05/24/2016   Preretinal fibrosis, left eye    Shingles outbreak     Past Surgical History:  Procedure Laterality Date   APPENDECTOMY     CATARACT EXTRACTION W/ INTRAOCULAR LENS  IMPLANT, BILATERAL     CHOLECYSTECTOMY     COLONOSCOPY     LUMBAR LAMINECTOMY     MEDIAL PARTIAL KNEE REPLACEMENT     left   TUBAL LIGATION     VAGINAL HYSTERECTOMY      Current Medications: Current Meds  Medication Sig   buPROPion (ZYBAN) 150 MG 12 hr tablet Take 150 mg by mouth every morning.   calcium carbonate (OSCAL) 1500 (600 Ca) MG TABS tablet Take 1,500 mg by mouth daily with breakfast.   citalopram (CELEXA) 20 MG tablet Take 20 mg by mouth every evening.   cycloSPORINE (RESTASIS) 0.05 % ophthalmic emulsion Place 1 drop into both eyes 2 (two) times daily.   dicyclomine (BENTYL) 20 MG tablet Take 10 mg by mouth in the morning and at bedtime.   estrogens, conjugated, (PREMARIN) 0.3 MG tablet Take 0.3 mg by mouth daily. Take daily for 21 days then do not take for 7 days.   levothyroxine (SYNTHROID, LEVOTHROID) 50 MCG tablet Take 50 mcg by mouth daily.   pantoprazole (PROTONIX) 40 MG tablet Take 40 mg by mouth 2 (two) times daily.   Triamterene-HCTZ (MAXZIDE-25 PO) Take 25 mg by mouth daily.    zolpidem (AMBIEN) 10 MG tablet Take 0.5 mg by mouth at bedtime as needed for sleep.   [DISCONTINUED] fexofenadine (ALLEGRA) 180 MG tablet Take 180 mg by mouth every evening.      Allergies:   Gemfibrozil, Lipitor [atorvastatin], Mercury, Migergot [ergotamine-caffeine], Morphine and related, and Tramadol   Social History   Socioeconomic History   Marital status: Married    Spouse name: Not on file   Number of children: Not on file   Years of education: Not on file   Highest education level: Not on file  Occupational History   Not on file  Tobacco Use   Smoking status:  Never   Smokeless tobacco: Never  Substance and Sexual Activity   Alcohol use: No    Alcohol/week: 0.0 standard drinks   Drug use: No   Sexual activity: Not on file  Other Topics Concern   Not on file  Social History Narrative   Not on file   Social Determinants of Health   Financial Resource Strain: Not on file  Food Insecurity: Not on file  Transportation Needs: Not on file  Physical Activity: Not on file  Stress: Not on file  Social Connections: Not on file     Family History: The patient's family history includes Asthma in her mother; Heart disease in her father; Hyperlipidemia in her mother; Hypertension in her mother.  ROS:   ROS Please see the history of present illness.     All other systems reviewed and are negative.  EKGs/Labs/Other Studies Reviewed:    The following studies were reviewed today:   EKG:  EKG is  ordered today.  The ekg ordered today is personally reviewed and demonstrates sinus tachycardia 110 bpm left axis deviation otherwise normal EKG    Physical Exam:    VS:  BP (!) 148/82 (BP Location: Right Arm, Patient Position: Sitting, Cuff Size: Normal)   Pulse (!) 110   Ht 5' (1.524 m)   Wt 174 lb (78.9 kg)   SpO2 97%   BMI 33.98 kg/m     Wt Readings from Last 3 Encounters:  04/14/21 174 lb (78.9 kg)  01/06/21 176 lb (79.8 kg)  01/25/19 173 lb (78.5 kg)     GEN:  Anxious does not look short of breath well nourished, well developed in no acute distress HEENT: Normal NECK: No JVD; No carotid bruits LYMPHATICS: No lymphadenopathy CARDIAC: RRR, no murmurs, rubs, gallops RESPIRATORY:  Clear to auscultation without rales, wheezing or rhonchi  ABDOMEN: Soft, non-tender, non-distended MUSCULOSKELETAL:  No edema; No deformity  SKIN: Warm and dry NEUROLOGIC:  Alert and oriented x 3 PSYCHIATRIC:  Normal affect     Signed, Shirlee More, MD  04/14/2021 2:29 PM    [place]

## 2021-04-14 ENCOUNTER — Encounter: Payer: Self-pay | Admitting: Cardiology

## 2021-04-14 ENCOUNTER — Ambulatory Visit (INDEPENDENT_AMBULATORY_CARE_PROVIDER_SITE_OTHER): Payer: Medicare Other | Admitting: Cardiology

## 2021-04-14 ENCOUNTER — Other Ambulatory Visit: Payer: Self-pay

## 2021-04-14 VITALS — BP 148/82 | HR 110 | Ht 60.0 in | Wt 174.0 lb

## 2021-04-14 DIAGNOSIS — I10 Essential (primary) hypertension: Secondary | ICD-10-CM

## 2021-04-14 DIAGNOSIS — R002 Palpitations: Secondary | ICD-10-CM | POA: Diagnosis not present

## 2021-04-14 DIAGNOSIS — R0602 Shortness of breath: Secondary | ICD-10-CM

## 2021-04-14 NOTE — Patient Instructions (Signed)
Medication Instructions:  Your physician has recommended you make the following change in your medication:  STOP: Allegra  *If you need a refill on your cardiac medications before your next appointment, please call your pharmacy*   Lab Work: Your physician recommends that you return for lab work in: [place] If you have labs (blood work) drawn today and your tests are completely normal, you will receive your results only by: [place] (if you have [place]) OR A paper copy in the mail If you have any lab test that is abnormal or we need to change your treatment, we will call you to review the results.   Testing/Procedures: Your physician has requested that you have an echocardiogram. Echocardiography is a painless test that uses sound waves to create images of your heart. It provides your doctor with information about the size and shape of your heart and how well your heart's chambers and valves are working. This procedure takes approximately one hour. There are no restrictions for this procedure.    Follow-Up: At [facility], you and your health needs are our priority.  As part of our continuing mission to provide you with exceptional heart care, we have created designated Provider Care Teams.  These Care Teams include your primary Cardiologist (physician) and Advanced Practice Providers (APPs -  Physician Assistants and Nurse Practitioners) who all work together to provide you with the care you need, when you need it.  We recommend signing up for the patient portal called "MyChart".  Sign up information is provided on this After Visit Summary.  MyChart is used to connect with patients for Virtual Visits (Telemedicine).  Patients are able to view lab/test results, encounter notes, upcoming appointments, etc.  Non-urgent messages can be sent to your provider as well.   To learn more about what you can do with MyChart, go to [email or web address].    Your next appointment:   6  week(s)  The format for your next appointment:   In Person  Provider:   Shirlee More, MD   Other Instructions 1. Avoid all over-the-counter antihistamines except Claritin/Loratadine and Zyrtec/Cetrizine. 2. Avoid all combination including cold sinus allergies flu decongestant and sleep medications 3. You can use Robitussin DM Mucinex and Mucinex DM for cough. 4. can use Tylenol aspirin ibuprofen and naproxen but no combinations such as sleep or sinus.

## 2021-04-15 LAB — PRO B NATRIURETIC PEPTIDE: NT-Pro BNP: 74 pg/mL (ref 0–301)

## 2021-04-29 ENCOUNTER — Ambulatory Visit (INDEPENDENT_AMBULATORY_CARE_PROVIDER_SITE_OTHER): Payer: Medicare Other

## 2021-04-29 ENCOUNTER — Other Ambulatory Visit: Payer: Self-pay

## 2021-04-29 DIAGNOSIS — R0602 Shortness of breath: Secondary | ICD-10-CM

## 2021-04-29 DIAGNOSIS — I10 Essential (primary) hypertension: Secondary | ICD-10-CM

## 2021-04-29 LAB — ECHOCARDIOGRAM COMPLETE
Area-P 1/2: 6.77 cm2
S' Lateral: 1.9 cm

## 2021-04-30 ENCOUNTER — Telehealth: Payer: Self-pay

## 2021-04-30 DIAGNOSIS — R0602 Shortness of breath: Secondary | ICD-10-CM

## 2021-04-30 NOTE — Telephone Encounter (Signed)
Spoke with patient regarding results and recommendation.  Patient verbalizes understanding and is agreeable to plan of care. Advised patient to call back with any issues or concerns.  

## 2021-04-30 NOTE — Telephone Encounter (Signed)
-----   Message from Richardo Priest, MD sent at 04/30/2021  1:53 PM EDT ----- Normal or stable result  Echocardiogram was normal.  I would like her to have a high-resolution CT of the chest and PFTs done at [facility].

## 2021-05-11 ENCOUNTER — Encounter (INDEPENDENT_AMBULATORY_CARE_PROVIDER_SITE_OTHER): Payer: Medicare Other | Admitting: Ophthalmology

## 2021-05-29 ENCOUNTER — Telehealth: Payer: Self-pay | Admitting: Cardiology

## 2021-05-29 NOTE — Telephone Encounter (Signed)
Did not need this encounter °

## 2021-06-03 ENCOUNTER — Ambulatory Visit: Payer: Medicare Other | Admitting: Cardiology

## 2021-08-04 ENCOUNTER — Encounter (INDEPENDENT_AMBULATORY_CARE_PROVIDER_SITE_OTHER): Payer: Medicare Other | Admitting: Ophthalmology

## 2021-08-04 ENCOUNTER — Other Ambulatory Visit: Payer: Self-pay

## 2021-08-04 DIAGNOSIS — I10 Essential (primary) hypertension: Secondary | ICD-10-CM

## 2021-08-04 DIAGNOSIS — H35033 Hypertensive retinopathy, bilateral: Secondary | ICD-10-CM

## 2021-08-04 DIAGNOSIS — H35373 Puckering of macula, bilateral: Secondary | ICD-10-CM

## 2021-08-04 DIAGNOSIS — H43813 Vitreous degeneration, bilateral: Secondary | ICD-10-CM | POA: Diagnosis not present

## 2021-08-04 DIAGNOSIS — H35342 Macular cyst, hole, or pseudohole, left eye: Secondary | ICD-10-CM | POA: Diagnosis not present

## 2021-08-06 ENCOUNTER — Encounter (INDEPENDENT_AMBULATORY_CARE_PROVIDER_SITE_OTHER): Payer: Medicare Other | Admitting: Ophthalmology

## 2021-08-06 ENCOUNTER — Other Ambulatory Visit: Payer: Self-pay

## 2021-08-06 DIAGNOSIS — H35342 Macular cyst, hole, or pseudohole, left eye: Secondary | ICD-10-CM

## 2021-08-13 DIAGNOSIS — H11021 Central pterygium of right eye: Secondary | ICD-10-CM | POA: Diagnosis not present

## 2021-08-13 DIAGNOSIS — H18593 Other hereditary corneal dystrophies, bilateral: Secondary | ICD-10-CM | POA: Diagnosis not present

## 2021-08-13 DIAGNOSIS — H5712 Ocular pain, left eye: Secondary | ICD-10-CM | POA: Diagnosis not present

## 2021-08-13 DIAGNOSIS — H1840 Unspecified corneal degeneration: Secondary | ICD-10-CM | POA: Diagnosis not present

## 2021-08-24 DIAGNOSIS — L578 Other skin changes due to chronic exposure to nonionizing radiation: Secondary | ICD-10-CM | POA: Diagnosis not present

## 2021-08-24 DIAGNOSIS — L821 Other seborrheic keratosis: Secondary | ICD-10-CM | POA: Diagnosis not present

## 2021-08-24 DIAGNOSIS — L570 Actinic keratosis: Secondary | ICD-10-CM | POA: Diagnosis not present

## 2021-09-23 DIAGNOSIS — Z20822 Contact with and (suspected) exposure to covid-19: Secondary | ICD-10-CM | POA: Diagnosis not present

## 2021-10-06 DIAGNOSIS — E782 Mixed hyperlipidemia: Secondary | ICD-10-CM | POA: Diagnosis not present

## 2021-10-06 DIAGNOSIS — I10 Essential (primary) hypertension: Secondary | ICD-10-CM | POA: Diagnosis not present

## 2021-10-06 DIAGNOSIS — K529 Noninfective gastroenteritis and colitis, unspecified: Secondary | ICD-10-CM | POA: Diagnosis not present

## 2021-10-06 DIAGNOSIS — Z0000 Encounter for general adult medical examination without abnormal findings: Secondary | ICD-10-CM | POA: Diagnosis not present

## 2021-10-06 DIAGNOSIS — E039 Hypothyroidism, unspecified: Secondary | ICD-10-CM | POA: Diagnosis not present

## 2021-10-06 DIAGNOSIS — Z79899 Other long term (current) drug therapy: Secondary | ICD-10-CM | POA: Diagnosis not present

## 2021-10-26 DIAGNOSIS — Z20822 Contact with and (suspected) exposure to covid-19: Secondary | ICD-10-CM | POA: Diagnosis not present

## 2021-10-30 DIAGNOSIS — M5459 Other low back pain: Secondary | ICD-10-CM | POA: Diagnosis not present

## 2021-10-30 DIAGNOSIS — M5451 Vertebrogenic low back pain: Secondary | ICD-10-CM | POA: Diagnosis not present

## 2021-10-30 DIAGNOSIS — M5136 Other intervertebral disc degeneration, lumbar region: Secondary | ICD-10-CM | POA: Diagnosis not present

## 2021-11-17 DIAGNOSIS — M5416 Radiculopathy, lumbar region: Secondary | ICD-10-CM | POA: Diagnosis not present

## 2021-11-28 DIAGNOSIS — M5416 Radiculopathy, lumbar region: Secondary | ICD-10-CM | POA: Diagnosis not present

## 2021-11-28 DIAGNOSIS — M5459 Other low back pain: Secondary | ICD-10-CM | POA: Diagnosis not present

## 2021-12-08 DIAGNOSIS — M5451 Vertebrogenic low back pain: Secondary | ICD-10-CM | POA: Diagnosis not present

## 2021-12-10 DIAGNOSIS — H04123 Dry eye syndrome of bilateral lacrimal glands: Secondary | ICD-10-CM | POA: Diagnosis not present

## 2021-12-10 DIAGNOSIS — H40013 Open angle with borderline findings, low risk, bilateral: Secondary | ICD-10-CM | POA: Diagnosis not present

## 2021-12-10 DIAGNOSIS — H16213 Exposure keratoconjunctivitis, bilateral: Secondary | ICD-10-CM | POA: Diagnosis not present

## 2021-12-14 DIAGNOSIS — Z6831 Body mass index (BMI) 31.0-31.9, adult: Secondary | ICD-10-CM | POA: Diagnosis not present

## 2021-12-14 DIAGNOSIS — D6851 Activated protein C resistance: Secondary | ICD-10-CM | POA: Diagnosis not present

## 2021-12-14 DIAGNOSIS — Z01818 Encounter for other preprocedural examination: Secondary | ICD-10-CM | POA: Diagnosis not present

## 2021-12-23 ENCOUNTER — Ambulatory Visit: Payer: Self-pay | Admitting: Orthopedic Surgery

## 2021-12-23 DIAGNOSIS — M48062 Spinal stenosis, lumbar region with neurogenic claudication: Secondary | ICD-10-CM

## 2021-12-25 ENCOUNTER — Ambulatory Visit: Payer: Self-pay | Admitting: Orthopedic Surgery

## 2021-12-25 NOTE — H&P (Signed)
[name] is an 75 y.o. female.   Chief Complaint: back and leg pain HPI: Reason for Visit: (normal) review of test results (lumbar MRI) Location (Lower Extremity): lower back pain on the left; LLE pain at times Severity: pain level 5/10 Quality: sharp; aching; burning; stabbing Aggravating Factors: standing for ; walking for ; sitting for ; lying down Associated Symptoms: weakness (BLE) Medications: prescribed prednisone and gabapentin 300 tid Notes: left L4/L5 ESI (11/17/21)  Past Medical History:  Diagnosis Date   Adjustment disorder with depressed mood    Allergic rhinitis    Asthma    Complication of anesthesia    Pt stated that anesthesia was not effective in previous cataract surgery. Pt stated " I could feel what they were doing; pt stated that during an Epidural i tought my legs were up but they were down"   Cough 10/28/2015   NO  10/29/2015      = 29  - Spirometry 10/28/2015  wnl including fef 25-75   - Trial off asthmanex 10/28/2015 > breathing improved/ only throat clearing remained 11/19/2015  - Sinus Ct 11/17/2015 > Paranasal sinuses are clear. - CT chest 11/17/2015 > No evidence of interstitial lung disease. No findings to explain the patient's cough - rechallenge 11/19/2015 with 1st gen h1  > then gabapentin 100 tid      Dyspnea 10/29/2015   10/28/2015  Walked RA x 3 laps @ 185 ft each stopped due to  End of study, nl pace, no sob or desat    - spirometry 10/28/2015 also wnl    GERD (gastroesophageal reflux disease)    Hallux limitus of left foot 04/21/2017   Heterozygous factor V Leiden mutation ([place])    History of hiatal hernia    History of kidney stones    Hyperlipidemia    Hypertensive disorder 06/10/2020   Hypothyroidism    Ingrown nail 04/21/2017   Insomnia    LPRD (laryngopharyngeal reflux disease) 03/10/2015   Macular hole of left eye    Migraine headache    Preretinal fibrosis, left 05/24/2016   Preretinal fibrosis, left eye    Shingles outbreak     Past  Surgical History:  Procedure Laterality Date   APPENDECTOMY     CATARACT EXTRACTION W/ INTRAOCULAR LENS  IMPLANT, BILATERAL     CHOLECYSTECTOMY     COLONOSCOPY     LUMBAR LAMINECTOMY     MEDIAL PARTIAL KNEE REPLACEMENT     left   TUBAL LIGATION     VAGINAL HYSTERECTOMY      Family History  Problem Relation Age of Onset   Asthma Mother    Hyperlipidemia Mother    Hypertension Mother    Heart disease Father    Social History:  reports that she has never smoked. She has never used smokeless tobacco. She reports that she does not drink alcohol and does not use drugs.  Allergies:  Allergies  Allergen Reactions   Lipitor [Atorvastatin] Other (See Comments)    Leg/extremity weakness   Lopid [Gemfibrozil] Other (See Comments)    Leg/extremity weakness   Migergot [Ergotamine-Caffeine] Other (See Comments)    Unknown reaction    Morphine And Related Other (See Comments)    Unknown reaction.   Tramadol Other (See Comments)    Unknown reaction   Mercury Rash    Medications: citalopram 20 mg tablet colestipoL 1 gram tablet Fish OiL gabapentin 300 mg capsule Lumigan 0.01 % eye drops Restasis 0.05 % eye drops in a dropperette  triamterene 37.5 mg-hydrochlorothiazide 25 mg capsule zolpidem 10 mg tablet  Review of Systems  Constitutional: Negative.   HENT: Negative.    Eyes: Negative.   Respiratory: Negative.    Cardiovascular: Negative.   Gastrointestinal: Negative.   Endocrine: Negative.   Genitourinary: Negative.   Musculoskeletal:  Positive for back pain and gait problem.  Skin: Negative.   Neurological:  Positive for weakness and numbness.  Hematological: Negative.   Psychiatric/Behavioral: Negative.      There were no vitals taken for this visit. Physical Exam Constitutional:      Appearance: Normal appearance.  HENT:     Head: Normocephalic and atraumatic.     Right Ear: External ear normal.     Left Ear: External ear normal.     Nose: Nose normal.      Mouth/Throat:     Pharynx: Oropharynx is clear.  Eyes:     Conjunctiva/sclera: Conjunctivae normal.  Cardiovascular:     Rate and Rhythm: Normal rate and regular rhythm.     Pulses: Normal pulses.     Heart sounds: Normal heart sounds.  Pulmonary:     Effort: Pulmonary effort is normal.     Breath sounds: Normal breath sounds.  Abdominal:     General: Bowel sounds are normal.  Musculoskeletal:     Cervical back: Normal range of motion.     Comments: Patient is quadricep weakness on the left compared to the right. Straight leg raise is negative. Motor is otherwise 5/5 lower extremities. Sensory exam is intact. No Babinski clonus. No instability hips knees and ankles. No DVT  Neurological:     Mental Status: She is alert.   MRI demonstrates severe spinal stenosis multifactorial at L3-4. Facet arthropathy at L4-5 that is severe. Mild stenosis. Previous laminectomy at L5-S1. Congenitally short pedicles.   Assessment/Plan Impression:  1. Neurogenic claudication secondary to multifactorial severe spinal stenosis at L4-5 due to facet hypertrophy and ligamentum flavum hypertrophy. 2. History of lumbar decompression L5-S1 3. Multilevel facet arthropathy and disc degeneration  Plan:  We discussed options given no help from an epidural steroid injection this patient reports multiyear duration of symptoms that are progressed and persistent lower extremity radicular pain left greater than right and with quadricep weakness we discussed a lumbar decompression L4-5  I had an extensive discussion with the patient concerning the pathology relevant anatomy and treatment options. At this point exhausting conservative treatment and in the presence of a neurologic deficit we discussed microlumbar decompression. I discussed the risks and benefits including bleeding, infection, DVT, PE, anesthetic complications, worsening in their symptoms, improvement in their symptoms, C SF leakage, epidural fibrosis, need  for future surgeries such as revision discectomy and lumbar fusion. I also indicated that this is an operation to basically decompress the nerve roots to allow recovery as opposed to fixing a herniated disc if it is encountered and that the incidence of recurrent chest disc herniation can approach 15%. Also that nerve root recovery is variable and may not recover completely. Any ligament or bone that is contributing to compressing the nerves will be removed as well.  I discussed the operative course including overnight in the hospital. Immediate ambulation. Follow-up in 2 weeks for suture removal. 6 weeks until healing of the herniation and surgical incision followed by 6 weeks of reconditioning and strengthening of the core musculature. Also discussed the need to employ the concepts of disc pressure management and core motion following the surgery to minimize the risk of recurrent disc  herniation. We will obtain preoperative clearance i if necessary and proceed accordingly.  Patient has had no history of MRSA or DVT. She is allergic to morphine. We discussed Kefzol. Postoperatively hydrocodone. Preoperative clearance. We discussed residual symptoms related to her multilevel disc degeneration that a multilevel fusion at this point in time is not indicated  Plan microlumbar decompression L3-4  Cecilie Kicks, PA-C for Dr Tonita Cong 12/25/2021, 8:41 AM

## 2021-12-25 NOTE — H&P (View-Only) (Signed)
[name] is an 76 y.o. female.   Chief Complaint: back and leg pain HPI: Reason for Visit: (normal) review of test results (lumbar MRI) Location (Lower Extremity): lower back pain on the left; LLE pain at times Severity: pain level 5/10 Quality: sharp; aching; burning; stabbing Aggravating Factors: standing for ; walking for ; sitting for ; lying down Associated Symptoms: weakness (BLE) Medications: prescribed prednisone and gabapentin 300 tid Notes: left L4/L5 ESI (11/17/21)  Past Medical History:  Diagnosis Date   Adjustment disorder with depressed mood    Allergic rhinitis    Asthma    Complication of anesthesia    Pt stated that anesthesia was not effective in previous cataract surgery. Pt stated " I could feel what they were doing; pt stated that during an Epidural i tought my legs were up but they were down"   Cough 10/28/2015   NO  10/29/2015      = 28  - Spirometry 10/28/2015  wnl including fef 25-75   - Trial off asthmanex 10/28/2015 > breathing improved/ only throat clearing remained 11/19/2015  - Sinus Ct 11/17/2015 > Paranasal sinuses are clear. - CT chest 11/17/2015 > No evidence of interstitial lung disease. No findings to explain the patient's cough - rechallenge 11/19/2015 with 1st gen h1  > then gabapentin 100 tid      Dyspnea 10/29/2015   10/28/2015  Walked RA x 3 laps @ 185 ft each stopped due to  End of study, nl pace, no sob or desat    - spirometry 10/28/2015 also wnl    GERD (gastroesophageal reflux disease)    Hallux limitus of left foot 04/21/2017   Heterozygous factor V Leiden mutation ([place])    History of hiatal hernia    History of kidney stones    Hyperlipidemia    Hypertensive disorder 06/10/2020   Hypothyroidism    Ingrown nail 04/21/2017   Insomnia    LPRD (laryngopharyngeal reflux disease) 03/10/2015   Macular hole of left eye    Migraine headache    Preretinal fibrosis, left 05/24/2016   Preretinal fibrosis, left eye    Shingles outbreak     Past  Surgical History:  Procedure Laterality Date   APPENDECTOMY     CATARACT EXTRACTION W/ INTRAOCULAR LENS  IMPLANT, BILATERAL     CHOLECYSTECTOMY     COLONOSCOPY     LUMBAR LAMINECTOMY     MEDIAL PARTIAL KNEE REPLACEMENT     left   TUBAL LIGATION     VAGINAL HYSTERECTOMY      Family History  Problem Relation Age of Onset   Asthma Mother    Hyperlipidemia Mother    Hypertension Mother    Heart disease Father    Social History:  reports that she has never smoked. She has never used smokeless tobacco. She reports that she does not drink alcohol and does not use drugs.  Allergies:  Allergies  Allergen Reactions   Lipitor [Atorvastatin] Other (See Comments)    Leg/extremity weakness   Lopid [Gemfibrozil] Other (See Comments)    Leg/extremity weakness   Migergot [Ergotamine-Caffeine] Other (See Comments)    Unknown reaction    Morphine And Related Other (See Comments)    Unknown reaction.   Tramadol Other (See Comments)    Unknown reaction   Mercury Rash    Medications: citalopram 20 mg tablet colestipoL 1 gram tablet Fish OiL gabapentin 300 mg capsule Lumigan 0.01 % eye drops Restasis 0.05 % eye drops in a dropperette  triamterene 37.5 mg-hydrochlorothiazide 25 mg capsule zolpidem 10 mg tablet  Review of Systems  Constitutional: Negative.   HENT: Negative.    Eyes: Negative.   Respiratory: Negative.    Cardiovascular: Negative.   Gastrointestinal: Negative.   Endocrine: Negative.   Genitourinary: Negative.   Musculoskeletal:  Positive for back pain and gait problem.  Skin: Negative.   Neurological:  Positive for weakness and numbness.  Hematological: Negative.   Psychiatric/Behavioral: Negative.      There were no vitals taken for this visit. Physical Exam Constitutional:      Appearance: Normal appearance.  HENT:     Head: Normocephalic and atraumatic.     Right Ear: External ear normal.     Left Ear: External ear normal.     Nose: Nose normal.      Mouth/Throat:     Pharynx: Oropharynx is clear.  Eyes:     Conjunctiva/sclera: Conjunctivae normal.  Cardiovascular:     Rate and Rhythm: Normal rate and regular rhythm.     Pulses: Normal pulses.     Heart sounds: Normal heart sounds.  Pulmonary:     Effort: Pulmonary effort is normal.     Breath sounds: Normal breath sounds.  Abdominal:     General: Bowel sounds are normal.  Musculoskeletal:     Cervical back: Normal range of motion.     Comments: Patient is quadricep weakness on the left compared to the right. Straight leg raise is negative. Motor is otherwise 5/5 lower extremities. Sensory exam is intact. No Babinski clonus. No instability hips knees and ankles. No DVT  Neurological:     Mental Status: She is alert.   MRI demonstrates severe spinal stenosis multifactorial at L3-4. Facet arthropathy at L4-5 that is severe. Mild stenosis. Previous laminectomy at L5-S1. Congenitally short pedicles.   Assessment/Plan Impression:  1. Neurogenic claudication secondary to multifactorial severe spinal stenosis at L4-5 due to facet hypertrophy and ligamentum flavum hypertrophy. 2. History of lumbar decompression L5-S1 3. Multilevel facet arthropathy and disc degeneration  Plan:  We discussed options given no help from an epidural steroid injection this patient reports multiyear duration of symptoms that are progressed and persistent lower extremity radicular pain left greater than right and with quadricep weakness we discussed a lumbar decompression L4-5  I had an extensive discussion with the patient concerning the pathology relevant anatomy and treatment options. At this point exhausting conservative treatment and in the presence of a neurologic deficit we discussed microlumbar decompression. I discussed the risks and benefits including bleeding, infection, DVT, PE, anesthetic complications, worsening in their symptoms, improvement in their symptoms, C SF leakage, epidural fibrosis, need  for future surgeries such as revision discectomy and lumbar fusion. I also indicated that this is an operation to basically decompress the nerve roots to allow recovery as opposed to fixing a herniated disc if it is encountered and that the incidence of recurrent chest disc herniation can approach 15%. Also that nerve root recovery is variable and may not recover completely. Any ligament or bone that is contributing to compressing the nerves will be removed as well.  I discussed the operative course including overnight in the hospital. Immediate ambulation. Follow-up in 2 weeks for suture removal. 6 weeks until healing of the herniation and surgical incision followed by 6 weeks of reconditioning and strengthening of the core musculature. Also discussed the need to employ the concepts of disc pressure management and core motion following the surgery to minimize the risk of recurrent disc  herniation. We will obtain preoperative clearance i if necessary and proceed accordingly.  Patient has had no history of MRSA or DVT. She is allergic to morphine. We discussed Kefzol. Postoperatively hydrocodone. Preoperative clearance. We discussed residual symptoms related to her multilevel disc degeneration that a multilevel fusion at this point in time is not indicated  Plan microlumbar decompression L3-4  Cecilie Kicks, PA-C for Dr Tonita Cong 12/25/2021, 8:41 AM

## 2021-12-28 ENCOUNTER — Encounter (HOSPITAL_COMMUNITY): Payer: Self-pay | Admitting: Specialist

## 2021-12-28 ENCOUNTER — Other Ambulatory Visit: Payer: Self-pay

## 2021-12-28 NOTE — Pre-Procedure Instructions (Signed)
[place], [place] - [place] [address] Phone: [phone] Fax: [place], [facility] - [address] AT [place] [address] [place] [address] Phone: [phone] Fax: [phone]   PCP - Mateo Flow, MD Cardiologist - Shirlee More, MD  EKG - 04/14/21 ECHO - 04/29/21  ERAS Protcol - NPO  COVID TEST- N  Anesthesia review: N  Patient verbally denies any shortness of breath, fever, cough and chest pain during phone call   -------------  SDW INSTRUCTIONS given:  Your procedure is scheduled on 12/31/21.  Report to [facility] Main Entrance "A" at 0530 A.M., and check in at the Admitting office.  Call this number if you have problems the morning of surgery:  [phone]   Remember:  Do not eat or drink after midnight the night before your surgery    Take these medicines the morning of surgery with A SIP OF WATER  buPROPion (WELLBUTRIN SR)  chlorpheniramine (CHLOR-TRIMETON)  cycloSPORINE (RESTASIS) Dextran 70-Hypromellose, PF, (ARTIFICIAL TEARS PF) gabapentin (NEURONTIN) pantoprazole (PROTONIX)  acetaminophen (TYLENOL)-if needed colestipol (COLESTID)-if needed   As of today, STOP taking any Aspirin (unless otherwise instructed by your surgeon) Aleve, Naproxen, Ibuprofen, Motrin, Advil, Goody's, BC's, all herbal medications, fish oil, and all vitamins.                      Do not wear jewelry, make up, or nail polish            Do not wear lotions, powders, perfumes/colognes, or deodorant.            Do not shave 48 hours prior to surgery.  Men may shave face and neck.            Do not bring valuables to the hospital.            [facility] is not responsible for any belongings or valuables.  Do NOT Smoke (Tobacco/Vaping) 24 hours prior to your procedure If you use a CPAP at night, you may bring all equipment for your overnight stay.    Contacts, glasses, dentures or bridgework may not be worn into surgery.      For patients admitted to the hospital, discharge time will be determined by your treatment team.   Patients discharged the day of surgery will not be allowed to drive home, and someone needs to stay with them for 24 hours.    Special instructions:   [facility]- Preparing For Surgery  Before surgery, you can play an important role. Because skin is not sterile, your skin needs to be as free of germs as possible. You can reduce the number of germs on your skin by washing with CHG (chlorahexidine gluconate) Soap before surgery.  CHG is an antiseptic cleaner which kills germs and bonds with the skin to continue killing germs even after washing.    Oral Hygiene is also important to reduce your risk of infection.  Remember - BRUSH YOUR TEETH THE MORNING OF SURGERY WITH YOUR REGULAR TOOTHPASTE  Please do not use if you have an allergy to CHG or antibacterial soaps. If your skin becomes reddened/irritated stop using the CHG.  Do not shave (including legs and underarms) for at least 48 hours prior to first CHG shower. It is OK to shave your face.  Please follow these instructions carefully.   Shower  the NIGHT BEFORE SURGERY and the MORNING OF SURGERY with DIAL Soap.   Pat yourself dry with a CLEAN TOWEL.  Wear CLEAN PAJAMAS to bed the night before surgery  Place CLEAN SHEETS on your bed the night of your first shower and DO NOT SLEEP WITH PETS.   Day of Surgery: Please shower morning of surgery  Wear Clean/Comfortable clothing the morning of surgery Do not apply any deodorants/lotions.   Remember to brush your teeth WITH YOUR REGULAR TOOTHPASTE.   Questions were answered. Patient verbalized understanding of instructions.

## 2021-12-30 NOTE — Anesthesia Preprocedure Evaluation (Signed)
Anesthesia Evaluation  Patient identified by MRN, date of birth, ID band Patient awake    Reviewed: Allergy & Precautions, NPO status , Patient's Chart, lab work & pertinent test results  History of Anesthesia Complications Negative for: history of anesthetic complications  Airway Mallampati: III  TM Distance: >3 FB Neck ROM: Full    Dental  (+) Teeth Intact, Dental Advisory Given   Pulmonary shortness of breath and with exertion, asthma ,  Snores at night, no witnessed apneas, has never had sleep study    Pulmonary exam normal breath sounds clear to auscultation       Cardiovascular hypertension (146/68 in preop, normally 120s-130s SBP), Pt. on medications +CHF (grade 1 diastolic dysfunction, normal LVEF)  Normal cardiovascular exam Rhythm:Regular Rate:Normal  Echo 04/2021 1. Left ventricular ejection fraction, by estimation, is 60 to 65%. The  left ventricle has normal function. The left ventricle has no regional  wall motion abnormalities. Left ventricular diastolic parameters are  consistent with Grade I diastolic  dysfunction (impaired relaxation).  2. Right ventricular systolic function is normal. The right ventricular  size is normal. There is normal pulmonary artery systolic pressure.  3. The mitral valve is normal in structure. No evidence of mitral valve  regurgitation. No evidence of mitral stenosis.  4. The aortic valve is normal in structure. Aortic valve regurgitation is  not visualized. No aortic stenosis is present.  5. The inferior vena cava is normal in size with greater than 50%  respiratory variability, suggesting right atrial pressure of 3 mmHg.    Neuro/Psych  Headaches, PSYCHIATRIC DISORDERS Depression    GI/Hepatic Neg liver ROS, hiatal hernia, GERD  Medicated and Controlled,  Endo/Other  Hypothyroidism BMI 31  Renal/GU negative Renal ROS  negative genitourinary   Musculoskeletal Spinal  stenosis    Abdominal (+) + obese,   Peds  Hematology negative hematology ROS (+)   Anesthesia Other Findings   Reproductive/Obstetrics negative OB ROS                           Anesthesia Physical Anesthesia Plan  ASA: 3  Anesthesia Plan: General   Post-op Pain Management: Tylenol PO (pre-op)*   Induction: Intravenous  PONV Risk Score and Plan: Ondansetron, Dexamethasone and Treatment may vary due to age or medical condition  Airway Management Planned: Oral ETT  Additional Equipment: None  Intra-op Plan:   Post-operative Plan: Extubation in OR  Informed Consent: I have reviewed the patients History and Physical, chart, labs and discussed the procedure including the risks, benefits and alternatives for the proposed anesthesia with the patient or authorized representative who has indicated his/her understanding and acceptance.     Dental advisory given  Plan Discussed with: CRNA  Anesthesia Plan Comments:        Anesthesia Quick Evaluation

## 2021-12-31 ENCOUNTER — Encounter (HOSPITAL_COMMUNITY)
Admission: RE | Disposition: A | Discharge status: Home / Self Care | Payer: Self-pay | Source: Home / Self Care | Attending: Specialist

## 2021-12-31 ENCOUNTER — Ambulatory Visit (HOSPITAL_COMMUNITY): Payer: Medicare Other | Admitting: Anesthesiology

## 2021-12-31 ENCOUNTER — Other Ambulatory Visit: Payer: Self-pay

## 2021-12-31 ENCOUNTER — Ambulatory Visit (HOSPITAL_BASED_OUTPATIENT_CLINIC_OR_DEPARTMENT_OTHER): Payer: Medicare Other | Admitting: Anesthesiology

## 2021-12-31 ENCOUNTER — Ambulatory Visit (HOSPITAL_COMMUNITY): Payer: Medicare Other

## 2021-12-31 ENCOUNTER — Encounter (HOSPITAL_COMMUNITY): Payer: Self-pay | Admitting: Specialist

## 2021-12-31 ENCOUNTER — Ambulatory Visit (HOSPITAL_COMMUNITY)
Admission: RE | Admit: 2021-12-31 | Discharge: 2022-01-01 | Disposition: A | Discharge status: Home / Self Care | Payer: Medicare Other | Attending: Specialist | Admitting: Specialist

## 2021-12-31 DIAGNOSIS — M48061 Spinal stenosis, lumbar region without neurogenic claudication: Secondary | ICD-10-CM | POA: Diagnosis not present

## 2021-12-31 DIAGNOSIS — G9612 Meningeal adhesions (cerebral) (spinal): Secondary | ICD-10-CM | POA: Diagnosis not present

## 2021-12-31 DIAGNOSIS — Z981 Arthrodesis status: Secondary | ICD-10-CM | POA: Diagnosis not present

## 2021-12-31 DIAGNOSIS — I509 Heart failure, unspecified: Secondary | ICD-10-CM

## 2021-12-31 DIAGNOSIS — M5416 Radiculopathy, lumbar region: Secondary | ICD-10-CM | POA: Diagnosis not present

## 2021-12-31 DIAGNOSIS — I110 Hypertensive heart disease with heart failure: Secondary | ICD-10-CM | POA: Diagnosis not present

## 2021-12-31 DIAGNOSIS — M48062 Spinal stenosis, lumbar region with neurogenic claudication: Secondary | ICD-10-CM

## 2021-12-31 DIAGNOSIS — Z01818 Encounter for other preprocedural examination: Secondary | ICD-10-CM | POA: Diagnosis not present

## 2021-12-31 DIAGNOSIS — I10 Essential (primary) hypertension: Secondary | ICD-10-CM | POA: Diagnosis not present

## 2021-12-31 DIAGNOSIS — E039 Hypothyroidism, unspecified: Secondary | ICD-10-CM | POA: Diagnosis not present

## 2021-12-31 DIAGNOSIS — G9519 Other vascular myelopathies: Secondary | ICD-10-CM | POA: Diagnosis not present

## 2021-12-31 HISTORY — DX: Pneumonia, unspecified organism: J18.9

## 2021-12-31 HISTORY — PX: LUMBAR LAMINECTOMY/DECOMPRESSION MICRODISCECTOMY: SHX5026

## 2021-12-31 LAB — CBC
HCT: 30.9 % — ABNORMAL LOW (ref 36.0–46.0)
Hemoglobin: 9.7 g/dL — ABNORMAL LOW (ref 12.0–15.0)
MCH: 27.6 pg (ref 26.0–34.0)
MCHC: 31.4 g/dL (ref 30.0–36.0)
MCV: 88 fL (ref 80.0–100.0)
Platelets: 218 10*3/uL (ref 150–400)
RBC: 3.51 MIL/uL — ABNORMAL LOW (ref 3.87–5.11)
RDW: 14.3 % (ref 11.5–15.5)
WBC: 8.3 10*3/uL (ref 4.0–10.5)
nRBC: 0 % (ref 0.0–0.2)

## 2021-12-31 LAB — BASIC METABOLIC PANEL
Anion gap: 9 (ref 5–15)
BUN: 14 mg/dL (ref 8–23)
CO2: 25 mmol/L (ref 22–32)
Calcium: 8.9 mg/dL (ref 8.9–10.3)
Chloride: 106 mmol/L (ref 98–111)
Creatinine, Ser: 1.25 mg/dL — ABNORMAL HIGH (ref 0.44–1.00)
GFR, Estimated: 45 mL/min — ABNORMAL LOW (ref >60)
Glucose, Bld: 108 mg/dL — ABNORMAL HIGH (ref 70–99)
Potassium: 3.6 mmol/L (ref 3.5–5.1)
Sodium: 140 mmol/L (ref 135–145)

## 2021-12-31 LAB — SURGICAL PCR SCREEN
MRSA, PCR: NEGATIVE
Staphylococcus aureus: NEGATIVE

## 2021-12-31 SURGERY — LUMBAR LAMINECTOMY/DECOMPRESSION MICRODISCECTOMY 1 LEVEL
Anesthesia: General

## 2021-12-31 MED ORDER — ACETAMINOPHEN 10 MG/ML IV SOLN
1000.0000 mg | INTRAVENOUS | Status: AC
  Administered 2021-12-31: 1000 mg via INTRAVENOUS
  Filled 2021-12-31: qty 100

## 2021-12-31 MED ORDER — PROPOFOL 10 MG/ML IV BOLUS
INTRAVENOUS | Status: AC
Start: 2021-12-31 — End: ?
  Filled 2021-12-31: qty 20

## 2021-12-31 MED ORDER — THROMBIN 20000 UNITS EX SOLR
CUTANEOUS | Status: AC
  Filled 2021-12-31: qty 20000

## 2021-12-31 MED ORDER — HYDROMORPHONE HCL 1 MG/ML IJ SOLN
0.2500 mg | INTRAMUSCULAR | Status: DC | PRN
  Administered 2021-12-31 (×2): 0.5 mg via INTRAVENOUS

## 2021-12-31 MED ORDER — SODIUM CHLORIDE 0.9 % IV SOLN
2000.0000 mg | INTRAVENOUS | Status: DC
  Filled 2021-12-31: qty 20

## 2021-12-31 MED ORDER — FENTANYL CITRATE (PF) 250 MCG/5ML IJ SOLN
INTRAMUSCULAR | Status: DC | PRN
  Administered 2021-12-31: 50 ug via INTRAVENOUS

## 2021-12-31 MED ORDER — HYDROMORPHONE HCL 1 MG/ML IJ SOLN: 0.5000 mg | INTRAMUSCULAR | Status: DC | PRN

## 2021-12-31 MED ORDER — BUPIVACAINE-EPINEPHRINE 0.5% -1:200000 IJ SOLN
INTRAMUSCULAR | Status: DC | PRN
  Administered 2021-12-31: 5 mL

## 2021-12-31 MED ORDER — HYDROCODONE-ACETAMINOPHEN 5-325 MG PO TABS
1.0000 | ORAL_TABLET | ORAL | 0 refills | Status: DC | PRN
Start: 2021-12-31 — End: 2023-01-04

## 2021-12-31 MED ORDER — LEVOTHYROXINE SODIUM 25 MCG PO TABS
50.0000 ug | ORAL_TABLET | Freq: Every evening | ORAL | Status: DC
Start: 2021-12-31 — End: 2022-01-01
  Administered 2021-12-31: 50 ug via ORAL
  Filled 2021-12-31: qty 2

## 2021-12-31 MED ORDER — ZOLPIDEM TARTRATE 5 MG PO TABS
5.0000 mg | ORAL_TABLET | Freq: Every day | ORAL | Status: DC
Start: 2021-12-31 — End: 2022-01-01
  Administered 2021-12-31: 5 mg via ORAL
  Filled 2021-12-31: qty 1

## 2021-12-31 MED ORDER — GABAPENTIN 300 MG PO CAPS
300.0000 mg | ORAL_CAPSULE | Freq: Three times a day (TID) | ORAL | Status: DC
  Administered 2021-12-31 – 2022-01-01 (×3): 300 mg via ORAL
  Filled 2021-12-31 (×3): qty 1

## 2021-12-31 MED ORDER — PROPOFOL 10 MG/ML IV BOLUS
INTRAVENOUS | Status: DC | PRN
  Administered 2021-12-31: 140 mg via INTRAVENOUS

## 2021-12-31 MED ORDER — BUPIVACAINE-EPINEPHRINE 0.5% -1:200000 IJ SOLN
INTRAMUSCULAR | Status: AC
  Filled 2021-12-31: qty 1

## 2021-12-31 MED ORDER — ONDANSETRON HCL 4 MG/2ML IJ SOLN
INTRAMUSCULAR | Status: DC | PRN
  Administered 2021-12-31: 4 mg via INTRAVENOUS

## 2021-12-31 MED ORDER — COLESTIPOL HCL 1 G PO TABS: 1.0000 g | ORAL_TABLET | Freq: Two times a day (BID) | ORAL | Status: DC | PRN

## 2021-12-31 MED ORDER — TRANEXAMIC ACID-NACL 1000-0.7 MG/100ML-% IV SOLN
1000.0000 mg | INTRAVENOUS | Status: AC
  Administered 2021-12-31: 1000 mg via INTRAVENOUS
  Filled 2021-12-31: qty 100

## 2021-12-31 MED ORDER — HYDROMORPHONE HCL 1 MG/ML IJ SOLN
INTRAMUSCULAR | Status: AC
  Filled 2021-12-31: qty 1

## 2021-12-31 MED ORDER — ONDANSETRON HCL 4 MG/2ML IJ SOLN
INTRAMUSCULAR | Status: AC
  Filled 2021-12-31: qty 2

## 2021-12-31 MED ORDER — POLYETHYLENE GLYCOL 3350 17 G PO PACK: 17.0000 g | PACK | Freq: Every day | ORAL | Status: DC | PRN

## 2021-12-31 MED ORDER — ROCURONIUM BROMIDE 10 MG/ML (PF) SYRINGE
PREFILLED_SYRINGE | INTRAVENOUS | Status: AC
  Filled 2021-12-31: qty 10

## 2021-12-31 MED ORDER — BISACODYL 5 MG PO TBEC: 5.0000 mg | DELAYED_RELEASE_TABLET | Freq: Every day | ORAL | Status: DC | PRN

## 2021-12-31 MED ORDER — ROCURONIUM BROMIDE 10 MG/ML (PF) SYRINGE
PREFILLED_SYRINGE | INTRAVENOUS | Status: DC | PRN
  Administered 2021-12-31: 100 mg via INTRAVENOUS

## 2021-12-31 MED ORDER — OXYCODONE HCL 5 MG PO TABS: 5.0000 mg | ORAL_TABLET | Freq: Once | ORAL | Status: DC | PRN

## 2021-12-31 MED ORDER — ALBUTEROL SULFATE HFA 108 (90 BASE) MCG/ACT IN AERS
INHALATION_SPRAY | RESPIRATORY_TRACT | Status: DC | PRN
  Administered 2021-12-31: 2 via RESPIRATORY_TRACT

## 2021-12-31 MED ORDER — PANTOPRAZOLE SODIUM 20 MG PO TBEC
20.0000 mg | DELAYED_RELEASE_TABLET | Freq: Two times a day (BID) | ORAL | Status: DC
  Administered 2021-12-31 – 2022-01-01 (×2): 20 mg via ORAL
  Filled 2021-12-31 (×2): qty 1

## 2021-12-31 MED ORDER — CYCLOSPORINE 0.05 % OP EMUL: 1.0000 [drp] | Freq: Two times a day (BID) | OPHTHALMIC | Status: DC

## 2021-12-31 MED ORDER — ACETAMINOPHEN 500 MG PO TABS
1000.0000 mg | ORAL_TABLET | Freq: Four times a day (QID) | ORAL | Status: AC
  Administered 2021-12-31 (×3): 1000 mg via ORAL
  Filled 2021-12-31 (×4): qty 2

## 2021-12-31 MED ORDER — CEFAZOLIN SODIUM-DEXTROSE 2-4 GM/100ML-% IV SOLN
2.0000 g | Freq: Three times a day (TID) | INTRAVENOUS | Status: AC
  Administered 2021-12-31 (×2): 2 g via INTRAVENOUS
  Filled 2021-12-31 (×2): qty 100

## 2021-12-31 MED ORDER — METHOCARBAMOL 1000 MG/10ML IJ SOLN: 500.0000 mg | Freq: Four times a day (QID) | INTRAVENOUS | Status: DC | PRN

## 2021-12-31 MED ORDER — HYDROCODONE-ACETAMINOPHEN 5-325 MG PO TABS
1.0000 | ORAL_TABLET | ORAL | Status: DC | PRN
  Administered 2021-12-31 – 2022-01-01 (×3): 1 via ORAL
  Filled 2021-12-31 (×3): qty 1

## 2021-12-31 MED ORDER — KCL IN DEXTROSE-NACL 20-5-0.45 MEQ/L-%-% IV SOLN: INTRAVENOUS | Status: AC

## 2021-12-31 MED ORDER — DEXAMETHASONE SODIUM PHOSPHATE 10 MG/ML IJ SOLN
INTRAMUSCULAR | Status: DC | PRN
  Administered 2021-12-31: 4 mg via INTRAVENOUS

## 2021-12-31 MED ORDER — DIPHENHYDRAMINE HCL 25 MG PO CAPS: 25.0000 mg | ORAL_CAPSULE | Freq: Every day | ORAL | Status: DC | PRN

## 2021-12-31 MED ORDER — ACETAMINOPHEN 325 MG PO TABS: 650.0000 mg | ORAL_TABLET | ORAL | Status: DC | PRN

## 2021-12-31 MED ORDER — DEXAMETHASONE SODIUM PHOSPHATE 10 MG/ML IJ SOLN
INTRAMUSCULAR | Status: AC
  Filled 2021-12-31: qty 1

## 2021-12-31 MED ORDER — AMISULPRIDE (ANTIEMETIC) 5 MG/2ML IV SOLN: 10.0000 mg | Freq: Once | INTRAVENOUS | Status: DC | PRN

## 2021-12-31 MED ORDER — ORAL CARE MOUTH RINSE
15.0000 mL | Freq: Once | OROMUCOSAL | Status: AC
Start: 2021-12-31 — End: 2021-12-31

## 2021-12-31 MED ORDER — CHLORHEXIDINE GLUCONATE 0.12 % MT SOLN
OROMUCOSAL | Status: AC
  Filled 2021-12-31: qty 15

## 2021-12-31 MED ORDER — METHOCARBAMOL 500 MG PO TABS
500.0000 mg | ORAL_TABLET | Freq: Four times a day (QID) | ORAL | Status: DC | PRN
  Administered 2021-12-31 – 2022-01-01 (×3): 500 mg via ORAL
  Filled 2021-12-31 (×3): qty 1

## 2021-12-31 MED ORDER — LATANOPROST 0.005 % OP SOLN
1.0000 [drp] | Freq: Every day | OPHTHALMIC | Status: DC
  Filled 2021-12-31: qty 2.5

## 2021-12-31 MED ORDER — ALUM & MAG HYDROXIDE-SIMETH 200-200-20 MG/5ML PO SUSP: 30.0000 mL | Freq: Four times a day (QID) | ORAL | Status: DC | PRN

## 2021-12-31 MED ORDER — ONDANSETRON 4 MG PO TBDP
8.0000 mg | ORAL_TABLET | Freq: Two times a day (BID) | ORAL | Status: DC | PRN
Start: 2021-12-31 — End: 2022-01-01

## 2021-12-31 MED ORDER — ADULT MULTIVITAMIN W/MINERALS CH
1.0000 | ORAL_TABLET | Freq: Every morning | ORAL | Status: DC
Start: 2022-01-01 — End: 2022-01-01
  Administered 2022-01-01: 1 via ORAL
  Filled 2021-12-31: qty 1

## 2021-12-31 MED ORDER — ONDANSETRON HCL 4 MG/2ML IJ SOLN: 4.0000 mg | Freq: Once | INTRAMUSCULAR | Status: DC | PRN

## 2021-12-31 MED ORDER — DOCUSATE SODIUM 100 MG PO CAPS: 100.0000 mg | ORAL_CAPSULE | Freq: Two times a day (BID) | ORAL | 1 refills | Status: AC | PRN

## 2021-12-31 MED ORDER — MENTHOL 3 MG MT LOZG: 1.0000 | LOZENGE | OROMUCOSAL | Status: DC | PRN

## 2021-12-31 MED ORDER — SUGAMMADEX SODIUM 200 MG/2ML IV SOLN
INTRAVENOUS | Status: DC | PRN
  Administered 2021-12-31: 200 mg via INTRAVENOUS

## 2021-12-31 MED ORDER — LACTATED RINGERS IV SOLN
INTRAVENOUS | Status: DC
Start: 2021-12-31 — End: 2021-12-31

## 2021-12-31 MED ORDER — DEXTRAN 70-HYPROMELLOSE (PF) 0.1-0.3 % OP SOLN: 1.0000 [drp] | Freq: Four times a day (QID) | OPHTHALMIC | Status: DC

## 2021-12-31 MED ORDER — LACTATED RINGERS IV SOLN: INTRAVENOUS | Status: DC

## 2021-12-31 MED ORDER — PHENOL 1.4 % MT LIQD: 1.0000 | OROMUCOSAL | Status: DC | PRN

## 2021-12-31 MED ORDER — HYDROCODONE-ACETAMINOPHEN 5-325 MG PO TABS: 2.0000 | ORAL_TABLET | ORAL | Status: DC | PRN

## 2021-12-31 MED ORDER — CEFAZOLIN SODIUM-DEXTROSE 2-4 GM/100ML-% IV SOLN
2.0000 g | INTRAVENOUS | Status: AC
  Administered 2021-12-31: 2 g via INTRAVENOUS
  Filled 2021-12-31: qty 100

## 2021-12-31 MED ORDER — DOCUSATE SODIUM 100 MG PO CAPS
100.0000 mg | ORAL_CAPSULE | Freq: Two times a day (BID) | ORAL | Status: DC
  Administered 2021-12-31 – 2022-01-01 (×3): 100 mg via ORAL
  Filled 2021-12-31 (×3): qty 1

## 2021-12-31 MED ORDER — TRIAMTERENE-HCTZ 37.5-25 MG PO TABS
1.0000 | ORAL_TABLET | Freq: Every morning | ORAL | Status: DC
  Filled 2021-12-31: qty 1

## 2021-12-31 MED ORDER — ACETAMINOPHEN 650 MG RE SUPP: 650.0000 mg | RECTAL | Status: DC | PRN

## 2021-12-31 MED ORDER — THROMBIN 20000 UNITS EX SOLR
CUTANEOUS | Status: DC | PRN
  Administered 2021-12-31: 20 mL via TOPICAL

## 2021-12-31 MED ORDER — OXYCODONE HCL 5 MG/5ML PO SOLN: 5.0000 mg | Freq: Once | ORAL | Status: DC | PRN

## 2021-12-31 MED ORDER — PHENYLEPHRINE HCL-NACL 20-0.9 MG/250ML-% IV SOLN
INTRAVENOUS | Status: DC | PRN
  Administered 2021-12-31: 30 ug/min via INTRAVENOUS

## 2021-12-31 MED ORDER — 0.9 % SODIUM CHLORIDE (POUR BTL) OPTIME
TOPICAL | Status: DC | PRN
  Administered 2021-12-31: 1000 mL

## 2021-12-31 MED ORDER — BUPROPION HCL ER (SR) 150 MG PO TB12
150.0000 mg | ORAL_TABLET | Freq: Every morning | ORAL | Status: DC
  Filled 2021-12-31: qty 1

## 2021-12-31 MED ORDER — TRANEXAMIC ACID 1000 MG/10ML IV SOLN
INTRAVENOUS | Status: DC | PRN
  Administered 2021-12-31: 2000 mg via TOPICAL

## 2021-12-31 MED ORDER — CHLORHEXIDINE GLUCONATE 0.12 % MT SOLN
15.0000 mL | Freq: Once | OROMUCOSAL | Status: AC
  Administered 2021-12-31: 15 mL via OROMUCOSAL

## 2021-12-31 MED ORDER — MIDAZOLAM HCL 2 MG/2ML IJ SOLN
INTRAMUSCULAR | Status: AC
  Filled 2021-12-31: qty 2

## 2021-12-31 MED ORDER — ASPIRIN 81 MG PO TBEC
81.0000 mg | DELAYED_RELEASE_TABLET | Freq: Every day | ORAL | 1 refills | Status: AC
Start: 2022-01-05 — End: ?

## 2021-12-31 MED ORDER — POLYVINYL ALCOHOL 1.4 % OP SOLN
1.0000 [drp] | Freq: Four times a day (QID) | OPHTHALMIC | Status: DC
  Administered 2021-12-31 – 2022-01-01 (×3): 1 [drp] via OPHTHALMIC
  Filled 2021-12-31: qty 15

## 2021-12-31 MED ORDER — PHENYLEPHRINE HCL (PRESSORS) 10 MG/ML IV SOLN
INTRAVENOUS | Status: DC | PRN
  Administered 2021-12-31: 80 ug via INTRAVENOUS
  Administered 2021-12-31: 40 ug via INTRAVENOUS
  Administered 2021-12-31: 80 ug via INTRAVENOUS

## 2021-12-31 MED ORDER — RISAQUAD PO CAPS
1.0000 | ORAL_CAPSULE | Freq: Every day | ORAL | Status: DC
  Administered 2021-12-31 – 2022-01-01 (×2): 1 via ORAL
  Filled 2021-12-31 (×2): qty 1

## 2021-12-31 MED ORDER — BUPROPION HCL ER (SR) 150 MG PO TB12
150.0000 mg | ORAL_TABLET | Freq: Every day | ORAL | Status: DC
  Administered 2022-01-01: 150 mg via ORAL
  Filled 2021-12-31: qty 1

## 2021-12-31 MED ORDER — FENTANYL CITRATE (PF) 250 MCG/5ML IJ SOLN
INTRAMUSCULAR | Status: AC
  Filled 2021-12-31: qty 5

## 2021-12-31 MED ORDER — LIDOCAINE 2% (20 MG/ML) 5 ML SYRINGE
INTRAMUSCULAR | Status: DC | PRN
  Administered 2021-12-31: 60 mg via INTRAVENOUS

## 2021-12-31 MED ORDER — LIDOCAINE 2% (20 MG/ML) 5 ML SYRINGE
INTRAMUSCULAR | Status: AC
  Filled 2021-12-31: qty 5

## 2021-12-31 MED ORDER — POLYETHYLENE GLYCOL 3350 17 G PO PACK: 17.0000 g | PACK | Freq: Every day | ORAL | 0 refills | Status: AC

## 2021-12-31 MED ORDER — CITALOPRAM HYDROBROMIDE 20 MG PO TABS
20.0000 mg | ORAL_TABLET | Freq: Every evening | ORAL | Status: DC
  Administered 2021-12-31: 20 mg via ORAL
  Filled 2021-12-31: qty 1

## 2021-12-31 SURGICAL SUPPLY — 65 items
BAG COUNTER SPONGE SURGICOUNT (BAG) ×4 IMPLANT
BAG DECANTER FOR FLEXI CONT (MISCELLANEOUS) IMPLANT
BAG SPNG CNTER NS LX DISP (BAG) ×2
BAND INSRT 18 STRL LF DISP RB (MISCELLANEOUS) ×2
BAND RUBBER #18 3X1/16 STRL (MISCELLANEOUS) ×6 IMPLANT
BUR EGG ELITE 5.0 (BURR) IMPLANT
BUR RND DIAMOND ELITE 4.0 (BURR) IMPLANT
BUR STRYKR EGG 5.0 (BURR) IMPLANT
CARTRIDGE OIL MAESTRO DRILL (MISCELLANEOUS) IMPLANT
CLEANER TIP ELECTROSURG 2X2 (MISCELLANEOUS) ×3 IMPLANT
CLSR STERI-STRIP ANTIMIC 1/2X4 (GAUZE/BANDAGES/DRESSINGS) ×1 IMPLANT
CNTNR URN SCR LID CUP LEK RST (MISCELLANEOUS) ×2 IMPLANT
CONT SPEC 4OZ STRL OR WHT (MISCELLANEOUS) ×2
DIFFUSER DRILL AIR PNEUMATIC (MISCELLANEOUS) IMPLANT
DRAPE LAPAROTOMY 100X72X124 (DRAPES) ×3 IMPLANT
DRAPE MICROSCOPE LEICA (MISCELLANEOUS) ×3 IMPLANT
DRAPE SHEET LG 3/4 BI-LAMINATE (DRAPES) ×3 IMPLANT
DRAPE SURG 17X11 SM STRL (DRAPES) ×3 IMPLANT
DRAPE UTILITY XL STRL (DRAPES) ×3 IMPLANT
DRSG AQUACEL AG ADV 3.5X 4 (GAUZE/BANDAGES/DRESSINGS) IMPLANT
DRSG AQUACEL AG ADV 3.5X 6 (GAUZE/BANDAGES/DRESSINGS) ×1 IMPLANT
DRSG TELFA 3X8 NADH (GAUZE/BANDAGES/DRESSINGS) IMPLANT
DURAPREP 26ML APPLICATOR (WOUND CARE) ×3 IMPLANT
DURASEAL SPINE SEALANT 3ML (MISCELLANEOUS) IMPLANT
ELECT BLADE 4.0 EZ CLEAN MEGAD (MISCELLANEOUS)
ELECT REM PT RETURN 9FT ADLT (ELECTROSURGICAL) ×2
ELECTRODE BLDE 4.0 EZ CLN MEGD (MISCELLANEOUS) IMPLANT
ELECTRODE REM PT RTRN 9FT ADLT (ELECTROSURGICAL) ×2 IMPLANT
GLOVE BIOGEL PI IND STRL 7.5 (GLOVE) ×2 IMPLANT
GLOVE BIOGEL PI INDICATOR 7.5 (GLOVE) ×1
GLOVE SS BIOGEL STRL SZ 7 (GLOVE) ×2 IMPLANT
GLOVE SS BIOGEL STRL SZ 8 (GLOVE) ×4 IMPLANT
GLOVE SUPERSENSE BIOGEL SZ 7 (GLOVE) ×1
GLOVE SUPERSENSE BIOGEL SZ 8 (GLOVE) ×2
GOWN STRL REUS W/ TWL LRG LVL3 (GOWN DISPOSABLE) ×2 IMPLANT
GOWN STRL REUS W/ TWL XL LVL3 (GOWN DISPOSABLE) ×2 IMPLANT
GOWN STRL REUS W/TWL LRG LVL3 (GOWN DISPOSABLE) ×2
GOWN STRL REUS W/TWL XL LVL3 (GOWN DISPOSABLE) ×4
IV CATH 14GX2 1/4 (CATHETERS) ×3 IMPLANT
KIT BASIN OR (CUSTOM PROCEDURE TRAY) ×3 IMPLANT
NDL SPNL 18GX3.5 QUINCKE PK (NEEDLE) ×4 IMPLANT
NEEDLE 22X1 1/2 (OR ONLY) (NEEDLE) ×3 IMPLANT
NEEDLE SPNL 18GX3.5 QUINCKE PK (NEEDLE) ×4 IMPLANT
OIL CARTRIDGE MAESTRO DRILL (MISCELLANEOUS)
PACK LAMINECTOMY NEURO (CUSTOM PROCEDURE TRAY) ×3 IMPLANT
PAD DRESSING TELFA 3X8 NADH (GAUZE/BANDAGES/DRESSINGS) IMPLANT
PATTIES SURGICAL .75X.75 (GAUZE/BANDAGES/DRESSINGS) ×3 IMPLANT
SPONGE SURGIFOAM ABS GEL 100 (HEMOSTASIS) ×3 IMPLANT
SPONGE T-LAP 4X18 ~~LOC~~+RFID (SPONGE) ×1 IMPLANT
STAPLER VISISTAT (STAPLE) IMPLANT
STRIP CLOSURE SKIN 1/2X4 (GAUZE/BANDAGES/DRESSINGS) ×3 IMPLANT
SUT NURALON 4 0 TR CR/8 (SUTURE) IMPLANT
SUT PROLENE 3 0 PS 2 (SUTURE) ×2 IMPLANT
SUT VIC AB 1 CT1 27 (SUTURE)
SUT VIC AB 1 CT1 27XBRD ANTBC (SUTURE) IMPLANT
SUT VIC AB 1-0 CT2 27 (SUTURE) ×2 IMPLANT
SUT VIC AB 2-0 CT1 27 (SUTURE)
SUT VIC AB 2-0 CT1 TAPERPNT 27 (SUTURE) IMPLANT
SUT VIC AB 2-0 CT2 27 (SUTURE) ×1 IMPLANT
SYR 3ML LL SCALE MARK (SYRINGE) ×3 IMPLANT
TOWEL GREEN STERILE (TOWEL DISPOSABLE) ×3 IMPLANT
TOWEL GREEN STERILE FF (TOWEL DISPOSABLE) ×3 IMPLANT
TRAY FOLEY MTR SLVR 16FR STAT (SET/KITS/TRAYS/PACK) ×3 IMPLANT
WIPE CHG CHLORHEXIDINE 2% (PERSONAL CARE ITEMS) ×3 IMPLANT
YANKAUER SUCT BULB TIP NO VENT (SUCTIONS) ×3 IMPLANT

## 2021-12-31 NOTE — Brief Op Note (Signed)
12/31/2021  7:21 AM  PATIENT:  Floyce Stakes  76 y.o. female  PRE-OPERATIVE DIAGNOSIS:  Spinal stensosis L3-4  POST-OPERATIVE DIAGNOSIS:  * No post-op diagnosis entered *  PROCEDURE:  Procedure(s) with comments: Microlumbar decompression L3-4 (N/A) - 2 hrs 3 C-Bed  SURGEON:  Surgeon(s) and Role:    Susa Day, MD - Primary  PHYSICIAN ASSISTANT:   ASSISTANTS: Nehemiah Massed   ANESTHESIA:   general  EBL:  50   BLOOD ADMINISTERED:none  DRAINS: none   LOCAL MEDICATIONS USED:  MARCAINE     SPECIMEN:  No Specimen  DISPOSITION OF SPECIMEN:  N/A  COUNTS:  YES  TOURNIQUET:  * No tourniquets in log *  DICTATION: .Other Dictation: Dictation Number [id number]  PLAN OF CARE: Admit for overnight observation  PATIENT DISPOSITION:  PACU - hemodynamically stable.   Delay start of Pharmacological VTE agent (>24hrs) due to surgical blood loss or risk of bleeding: yes

## 2021-12-31 NOTE — Op Note (Signed)
NAMEAILEENA, [name] MEDICAL RECORD NO: [id number] ACCOUNT NO: [id number] DATE OF BIRTH: 11-06-1945 FACILITY: [facility] LOCATION: [facility] PHYSICIAN: Johnn Hai, MD  Operative Report   DATE OF PROCEDURE: 12/31/2021  PREOPERATIVE DIAGNOSIS:  Spinal stenosis, L3-L4.  POSTOPERATIVE DIAGNOSES:  Spinal stenosis, L3-L4, dural adhesions, L3-L4.  PROCEDURE PERFORMED: 1.  Microlumbar decompression with bilateral laminotomies and L3 and L4 foraminotomies. 2.  Lysis of extensive epidural adhesions.  ANESTHESIA:  General.  ASSISTANT:  Nehemiah Massed, PA.  HISTORY:  A 76 year old female with bilateral lower extremity radicular pain secondary to severe spinal stenosis at L3-L4 predominant left side with quadricep weakness.  Indicated for decompression.  Risks and benefits discussed including bleeding,  infection, damage to neurovascular structures, no change in symptoms, worsening symptoms, DVT, PE, anesthetic complications, etc.  DESCRIPTION OF PROCEDURE:  With the patient in supine position.  After induction of adequate general anesthesia, 2 grams Kefzol, she was placed prone on the Wilson frame.  All bony prominences were well padded.  Lumbar region was prepped and draped in  the usual sterile fashion.  Two 18-gauge spinal needle was utilized to localize the 3-4 interspace, confirmed with x-ray.  Incision was made from the spinous process of 3-4 subcutaneous tissue was dissected.  Electrocautery was utilized to achieve  hemostasis.  Dorsal lumbar fascia divided in line with skin incision.  Paraspinous muscle elevated from lamina of 3-4.  Operating microscope was draped and brought in the surgical field.  Confirmatory radiograph obtained with Kochers on the spinous  processes of 3 and 4.  McCulloch retractor was placed.  Spinous processes of 3 and 4 were removed with Leksell rongeur.  I then began centrally at the caudad edge of 3 with a 2 mm Kerrison, extending it cephalad as well as a 3 mm Kerrison  to the point  detaching the ligamentum flavum.  Following this, I skeletonized the cephalad portion of the L4 lamina.  Next, we began removing ligamentum flavum from the interspace, which was severely hypertrophic, compressing the thecal sac, which was started from  the central portion and began the decompression bilaterally.  First on the right.  Woodson retractor was utilized to protect the English as a second language teacher.  We began removing the ligamentum flavum. First detaching it from the caudad edge of 3.  Then, the cephalad  edge of 4.  I then sequentially removed hypertrophic ligamentum.  There were severe adhesions of the ligamentum to the lateral aspect of the thecal sac at 3-4.  I used a Penfield and a micro curette to meticulously develop a plane between the adhesions  to the ligamentum flavum and a region that appeared to be prior epidural steroid injection.  I then sequentially developed this plane to the lateral recess.  I performed a foraminotomy of L4 and smooth the laminotomy with a straight curette.   Foraminotomy of L3.  There was good restoration of the thecal sac here.  This was covered with a patty.  I then decompressed the left side in a similar fashion.  There was severe hypertrophic ligamentum flavum noted.  I developed a plane between the  thecal sac and the ligamentum flavum.  This was removed with a 2 mm Kerrison.  First from the caudad edge of 3.  A straight curette utilized to detach ligamentum from the cephalad edge of 4.  I performed a foraminotomy of 4 with the neural elements  protected.  I then from the pedicle of 4, decompressed the lateral recess to the medial border of the  pedicle working cephalad.  Protecting the neural elements.  Foraminotomy of 3 was performed as well.  Following this, there was good restoration of the  thecal sac and a Woodson probe passed freely out the foramen of 3 and 4.  Confirmatory radiograph obtained with a marker in the foramen at 3 and 4.  We had used  thrombin-soaked Gelfoam and TXA to achieve hemostasis.  Bone wax was placed on the cancellous  surfaces.  Copious irrigation was performed.  No active bleeding or CSF leakage was noted.  I removed the McCulloch retractor, irrigated the paraspinous musculature.  Bipolar cautery was utilized to achieve hemostasis.  No active bleeding noted.  After  copious irrigation, I then reapproximated the dorsal lumbar fascia with #1 Vicryl in interrupted figure-of-eight sutures, subcutaneous with 2-0 and skin with Prolene.  Sterile dressing applied.  Placed supine on the hospital bed, extubated without  difficulty and transported to the recovery room in satisfactory condition.  The patient tolerated the procedure well.  No complications.  Assistant, Lacie Draft, [place], was used throughout the case for suction, the patient positioning, closure.   PUS D: 12/31/2021 10:34:30 am T: 12/31/2021 2:54:00 pm  JOB: [id number]/ [id number]

## 2021-12-31 NOTE — Transfer of Care (Signed)
Immediate Anesthesia Transfer of Care Note  Patient: [name]  Procedure(s) Performed: Microlumbar decompression Lumbar three-four  Patient Location: PACU  Anesthesia Type:General  Level of Consciousness: awake, alert  and oriented  Airway & Oxygen Therapy: Patient Spontanous Breathing and Patient connected to nasal cannula oxygen  Post-op Assessment: Report given to RN, Post -op Vital signs reviewed and stable and Patient moving all extremities X 4  Post vital signs: Reviewed and stable  Last Vitals:  Vitals Value Taken Time  BP 125/76 12/31/21 1052  Temp    Pulse 87 12/31/21 1055  Resp 18 12/31/21 1055  SpO2 93 % 12/31/21 1055  Vitals shown include unvalidated device data.  Last Pain:  Vitals:   12/31/21 0633  TempSrc:   PainSc: 4       Patients Stated Pain Goal: 0 (09/81/19 1478)  Complications: No notable events documented.

## 2021-12-31 NOTE — Anesthesia Procedure Notes (Signed)
Procedure Name: Intubation Date/Time: 12/31/2021 7:49 AM  Performed by: Wilburn Cornelia, CRNAPre-anesthesia Checklist: Patient identified, Emergency Drugs available, Suction available and Patient being monitored Patient Re-evaluated:Patient Re-evaluated prior to induction Oxygen Delivery Method: Circle System Utilized Preoxygenation: Pre-oxygenation with 100% oxygen Induction Type: IV induction Ventilation: Mask ventilation without difficulty Laryngoscope Size: Mac and 3 Grade View: Grade II Tube type: Oral Tube size: 7.0 mm Number of attempts: 1 Airway Equipment and Method: Stylet and Oral airway Placement Confirmation: ETT inserted through vocal cords under direct vision, positive ETCO2 and breath sounds checked- equal and bilateral Secured at: 20 cm Tube secured with: Tape Dental Injury: Teeth and Oropharynx as per pre-operative assessment  Comments: Lauren Cozart, SRNA placed ETT under supervision.

## 2021-12-31 NOTE — Evaluation (Signed)
Physical Therapy Evaluation Patient Details Name: [name] MRN: [id number] DOB: Jan 26, 1946 Today's Date: 12/31/2021  History of Present Illness  76 y.o. female admitted and underwent Microlumbar decompression with bilateral laminotomies and L3 and L4 foraminotomies 12/31/21. PMH: HTN, hyperlipidemia, and hypothyroidism  Clinical Impression  Patient is s/p above surgery presenting with functional limitations due to the deficits listed below (see PT Problem List). Educated on back precautions, handout provided, reviewed with husband as well, very supportive during evaluation. Practiced bed mobility (log roll,) transfers, and ambulated 75 with RW for light support, pt anxious without AD; will likely benefit from RW to take home. All questions answered. Declined practicing stair navigation this afternoon. Patient will benefit from skilled PT to increase their independence and safety with mobility to allow discharge to the venue listed below. Strong family support and available as needed from husband. Will follow and progress until d/c.        Recommendations for follow up therapy are one component of a multi-disciplinary discharge planning process, led by the attending physician.  Recommendations may be updated based on patient status, additional functional criteria and insurance authorization.  Follow Up Recommendations Follow physician's recommendations for discharge plan and follow up therapies      Assistance Recommended at Discharge Intermittent Supervision/Assistance  Patient can return home with the following  A little help with bathing/dressing/bathroom;Assistance with cooking/housework;Assist for transportation;Help with stairs or ramp for entrance    Equipment Recommendations Rolling walker (2 wheels)  Recommendations for Other Services       Functional Status Assessment Patient has had a recent decline in their functional status and demonstrates the ability to make significant  improvements in function in a reasonable and predictable amount of time.     Precautions / Restrictions Precautions Precautions: Back Precaution Booklet Issued: Yes (comment) Precaution Comments: reviewed handout Restrictions Weight Bearing Restrictions: No      Mobility  Bed Mobility Overal bed mobility: Needs Assistance Bed Mobility: Rolling, Sidelying to Sit, Sit to Sidelying Rolling: Min assist Sidelying to sit: Min assist     Sit to sidelying: Min assist General bed mobility comments: Educated on log roll. Very minimal assist to facilitate desired movement and maintain back precautions.    Transfers Overall transfer level: Needs assistance Equipment used: Rolling walker (2 wheels) Transfers: Sit to/from Stand Sit to Stand: Min guard           General transfer comment: min guard for safety, slight sway upon standing, corrected with support from RW.    Ambulation/Gait Ambulation/Gait assistance: Supervision Gait Distance (Feet): 75 Feet Assistive device: Rolling walker (2 wheels) Gait Pattern/deviations: Step-through pattern, Decreased stride length Gait velocity: decreased     General Gait Details: Educated on safe AD use with RW. Pt uncomfortable attempting gait without AD. Shows good stability with RW use. No overt LOB or erratic control with AD. Slower with decreased stride length but overall looks safe.  Stairs Stairs:  (Declines at this time.)          Wheelchair Mobility    Modified Rankin (Stroke Patients Only)       Balance Overall balance assessment: Mild deficits observed, not formally tested                                           Pertinent Vitals/Pain Pain Assessment Pain Assessment: 0-10 Pain Score: 5  Pain Location: back Pain  Descriptors / Indicators: Burning Pain Intervention(s): Monitored during session, Repositioned    Home Living Family/patient expects to be discharged to:: Private residence Living  Arrangements: Spouse/significant other Available Help at Discharge: Family;Available 24 hours/day Type of Home: House Home Access: Stairs to enter Entrance Stairs-Rails: None Entrance Stairs-Number of Steps: 3   Home Layout: Two level;Full bath on main level;Able to live on main level with bedroom/bathroom Home Equipment: Shower seat      Prior Function Prior Level of Function : Independent/Modified Independent                     Hand Dominance        Extremity/Trunk Assessment   Upper Extremity Assessment Upper Extremity Assessment: Defer to OT evaluation    Lower Extremity Assessment Lower Extremity Assessment: Generalized weakness    Cervical / Trunk Assessment Cervical / Trunk Assessment: Normal  Communication   Communication: No difficulties  Cognition Arousal/Alertness: Awake/alert Behavior During Therapy: WFL for tasks assessed/performed Overall Cognitive Status: Within Functional Limits for tasks assessed                                          General Comments      Exercises     Assessment/Plan    PT Assessment Patient needs continued PT services  PT Problem List Decreased strength;Decreased range of motion;Decreased activity tolerance;Decreased balance;Decreased mobility;Decreased knowledge of use of DME;Decreased knowledge of precautions;Pain;Obesity       PT Treatment Interventions DME instruction;Gait training;Stair training;Functional mobility training;Therapeutic activities;Therapeutic exercise;Balance training;Neuromuscular re-education;Patient/family education    PT Goals (Current goals can be found in the Care Plan section)  Acute Rehab PT Goals Patient Stated Goal: Get well PT Goal Formulation: With patient/family Time For Goal Achievement: 01/04/22 Potential to Achieve Goals: Good    Frequency Min 5X/week     Co-evaluation               AM-PAC PT "6 Clicks" Mobility  Outcome Measure Help needed  turning from your back to your side while in a flat bed without using bedrails?: A Little Help needed moving from lying on your back to sitting on the side of a flat bed without using bedrails?: A Little Help needed moving to and from a bed to a chair (including a wheelchair)?: A Little Help needed standing up from a chair using your arms (e.g., wheelchair or bedside chair)?: A Little Help needed to walk in hospital room?: A Little Help needed climbing 3-5 steps with a railing? : A Little 6 Click Score: 18    End of Session Equipment Utilized During Treatment: Gait belt Activity Tolerance: Patient tolerated treatment well Patient left: in bed;with call bell/phone within reach;with family/visitor present;with SCD's reapplied Nurse Communication: Mobility status PT Visit Diagnosis: Other abnormalities of gait and mobility (R26.89);Muscle weakness (generalized) (M62.81);Difficulty in walking, not elsewhere classified (R26.2);Pain Pain - part of body:  (back)    Time: 3267-1245 PT Time Calculation (min) (ACUTE ONLY): 28 min   Charges:   PT Evaluation $PT Eval Low Complexity: 1 Low PT Treatments $Therapeutic Activity: 8-22 mins        Candie Mile, PT   [name] 12/31/2021, 4:15 PM

## 2021-12-31 NOTE — Interval H&P Note (Signed)
History and Physical Interval Note:  12/31/2021 7:08 AM  [name]  has presented today for surgery, with the diagnosis of Spinal stensosis L3-4.  The various methods of treatment have been discussed with the patient and family. After consideration of risks, benefits and other options for treatment, the patient has consented to  Procedure(s) with comments: Microlumbar decompression L3-4 (N/A) - 2 hrs 3 C-Bed as a surgical intervention.  The patient's history has been reviewed, patient examined, no change in status, stable for surgery.  I have reviewed the patient's chart and labs.  Questions were answered to the patient's satisfaction.     Johnn Hai

## 2021-12-31 NOTE — Anesthesia Postprocedure Evaluation (Signed)
Anesthesia Post Note  Patient: MYRIA STEENBERGEN  Procedure(s) Performed: Microlumbar decompression Lumbar three-four     Patient location during evaluation: PACU Anesthesia Type: General Level of consciousness: awake and alert, oriented and patient cooperative Pain management: pain level controlled Vital Signs Assessment: post-procedure vital signs reviewed and stable Respiratory status: spontaneous breathing, nonlabored ventilation and respiratory function stable Cardiovascular status: blood pressure returned to baseline and stable Postop Assessment: no apparent nausea or vomiting Anesthetic complications: no   No notable events documented.  Last Vitals:  Vitals:   12/31/21 1130 12/31/21 1159  BP: 112/79 (!) 111/53  Pulse: 88 85  Resp: 15 16  Temp: 36.7 C   SpO2: 92% 96%    Last Pain:  Vitals:   12/31/21 0633  TempSrc:   PainSc: [place]

## 2022-01-01 ENCOUNTER — Encounter (HOSPITAL_COMMUNITY): Payer: Self-pay | Admitting: Specialist

## 2022-01-01 DIAGNOSIS — I10 Essential (primary) hypertension: Secondary | ICD-10-CM | POA: Diagnosis not present

## 2022-01-01 DIAGNOSIS — M48061 Spinal stenosis, lumbar region without neurogenic claudication: Secondary | ICD-10-CM | POA: Diagnosis not present

## 2022-01-01 LAB — CBC
HCT: 27.3 % — ABNORMAL LOW (ref 36.0–46.0)
Hemoglobin: 8.9 g/dL — ABNORMAL LOW (ref 12.0–15.0)
MCH: 28.3 pg (ref 26.0–34.0)
MCHC: 32.6 g/dL (ref 30.0–36.0)
MCV: 86.9 fL (ref 80.0–100.0)
Platelets: 193 10*3/uL (ref 150–400)
RBC: 3.14 MIL/uL — ABNORMAL LOW (ref 3.87–5.11)
RDW: 14.4 % (ref 11.5–15.5)
WBC: 13.1 10*3/uL — ABNORMAL HIGH (ref 4.0–10.5)
nRBC: 0 % (ref 0.0–0.2)

## 2022-01-01 LAB — BASIC METABOLIC PANEL
Anion gap: 12 (ref 5–15)
BUN: 14 mg/dL (ref 8–23)
CO2: 24 mmol/L (ref 22–32)
Calcium: 8.7 mg/dL — ABNORMAL LOW (ref 8.9–10.3)
Chloride: 99 mmol/L (ref 98–111)
Creatinine, Ser: 1.4 mg/dL — ABNORMAL HIGH (ref 0.44–1.00)
GFR, Estimated: 39 mL/min — ABNORMAL LOW (ref >60)
Glucose, Bld: 130 mg/dL — ABNORMAL HIGH (ref 70–99)
Potassium: 4.1 mmol/L (ref 3.5–5.1)
Sodium: 135 mmol/L (ref 135–145)

## 2022-01-01 NOTE — Progress Notes (Signed)
Subjective: 1 Day Post-Op Procedure(s) (LRB): Microlumbar decompression Lumbar three-four (N/A) Patient reports pain as 4 on 0-10 scale.   Denies CP or SOB.  Voiding without difficulty. Positive flatus. Objective: Vital signs in last 24 hours: Temp:  [98 F (36.7 C)-98.7 F (37.1 C)] 98.7 F (37.1 C) (07/07 0750) Pulse Rate:  [81-91] 84 (07/07 0750) Resp:  [13-18] 16 (07/07 0750) BP: (105-125)/(51-79) 116/56 (07/07 0750) SpO2:  [92 %-97 %] 97 % (07/07 0750)  Intake/Output from previous day: 07/06 0701 - 07/07 0700 In: 1500.5 [P.O.:600; I.V.:600; IV Piggyback:300.5] Out: 450 [Urine:400; Blood:50] Intake/Output this shift: No intake/output data recorded.  Recent Labs    12/31/21 0606 01/01/22 0545  HGB 9.7* 8.9*   Recent Labs    12/31/21 0606 01/01/22 0545  WBC 8.3 13.1*  RBC 3.51* 3.14*  HCT 30.9* 27.3*  PLT 218 193   Recent Labs    12/31/21 0606 01/01/22 0545  NA 140 135  K 3.6 4.1  CL 106 99  CO2 25 24  BUN 14 14  CREATININE 1.25* 1.40*  GLUCOSE 108* 130*  CALCIUM 8.9 8.7*   No results for input(s): "LABPT", "INR" in the last 72 hours.  Neurologically intact Neurovascular intact Sensation intact distally Incision: dressing C/D/I Exam per Nehemiah Massed  Assessment/Plan:  1 Day Post-Op Procedure(s) (LRB): Microlumbar decompression Lumbar three-four (N/A) Advance diet Up with therapy D/C IV fluids Discharge home with home health D/C instr given   Principal Problem:   Spinal stenosis, lumbar      [name] 01/01/2022, '@NOW'$ 

## 2022-01-01 NOTE — Plan of Care (Signed)

## 2022-01-01 NOTE — Evaluation (Signed)
Occupational Therapy Evaluation Patient Details Name: [name] MRN: [id number] DOB: 12-06-1945 Today's Date: 01/01/2022   History of Present Illness 76 y.o. female admitted and underwent Microlumbar decompression with bilateral laminotomies and L3 and L4 foraminotomies 12/31/21. PMH: HTN, hyperlipidemia, and hypothyroidism   Clinical Impression   PTA, pt was living with her husband and was independent. Currently, pt performing at Mod I level for ADLs and functional mobility with increased time for adherence to precautions. Provided education and handout on back precautions, bed mobility, brace management, grooming, LB ADLs, toileting, and shower transfer with seat; pt demonstrated understanding. Answered all pt questions. Recommend dc home once medically stable per physician. All acute OT needs met and will sign off. Thank you.     Recommendations for follow up therapy are one component of a multi-disciplinary discharge planning process, led by the attending physician.  Recommendations may be updated based on patient status, additional functional criteria and insurance authorization.   Follow Up Recommendations  No OT follow up    Assistance Recommended at Discharge PRN  Patient can return home with the following      Functional Status Assessment  Patient has had a recent decline in their functional status and demonstrates the ability to make significant improvements in function in a reasonable and predictable amount of time.  Equipment Recommendations  None recommended by OT    Recommendations for Other Services       Precautions / Restrictions Precautions Precautions: Back Precaution Booklet Issued: Yes (comment) Precaution Comments: reviewed handout Required Braces or Orthoses: Other Brace Other Brace: No brace per MD Restrictions Weight Bearing Restrictions: No      Mobility Bed Mobility Overal bed mobility: Modified Independent             General bed mobility  comments: Increased time for log roll in returning to supine    Transfers Overall transfer level: Modified independent                        Balance Overall balance assessment: No apparent balance deficits (not formally assessed)                                         ADL either performed or assessed with clinical judgement   ADL Overall ADL's : Modified independent                                       General ADL Comments: Providing education and handout on back precautions, brace manangement, bed mobility, grooming, toileting, LB ADLs, and shower trasnfer. Increased time for adherance to precautions     Vision         Perception     Praxis      Pertinent Vitals/Pain Pain Assessment Pain Assessment: Faces Faces Pain Scale: Hurts a little bit Pain Location: back Pain Descriptors / Indicators: Discomfort Pain Intervention(s): Monitored during session     Hand Dominance     Extremity/Trunk Assessment Upper Extremity Assessment Upper Extremity Assessment: Overall WFL for tasks assessed   Lower Extremity Assessment Lower Extremity Assessment: Defer to PT evaluation   Cervical / Trunk Assessment Cervical / Trunk Assessment: Back Surgery   Communication Communication Communication: No difficulties   Cognition Arousal/Alertness: Awake/alert Behavior During Therapy: WFL for tasks assessed/performed Overall  Cognitive Status: Within Functional Limits for tasks assessed                                       General Comments       Exercises     Shoulder Instructions      Home Living Family/patient expects to be discharged to:: Private residence Living Arrangements: Spouse/significant other Available Help at Discharge: Family;Available 24 hours/day Type of Home: House Home Access: Stairs to enter [employer] of Steps: 3 Entrance Stairs-Rails: None Home Layout: Two level;Full bath on  main level;Able to live on main level with bedroom/bathroom     Bathroom Shower/Tub: Occupational psychologist: Handicapped height     Home Equipment: Shower seat          Prior Functioning/Environment Prior Level of Function : Independent/Modified Independent                        OT Problem List: Decreased activity tolerance;Impaired balance (sitting and/or standing);Decreased range of motion;Decreased knowledge of precautions;Decreased knowledge of use of DME or AE      OT Treatment/Interventions:      OT Goals(Current goals can be found in the care plan section) Acute Rehab OT Goals Patient Stated Goal: Go home OT Goal Formulation: All assessment and education complete, DC therapy  OT Frequency:      Co-evaluation              AM-PAC OT "6 Clicks" Daily Activity     Outcome Measure Help from another person eating meals?: None Help from another person taking care of personal grooming?: None Help from another person toileting, which includes using toliet, bedpan, or urinal?: None Help from another person bathing (including washing, rinsing, drying)?: None Help from another person to put on and taking off regular upper body clothing?: None Help from another person to put on and taking off regular lower body clothing?: None 6 Click Score: 24   End of Session Nurse Communication: Mobility status  Activity Tolerance: Patient tolerated treatment well Patient left: in bed;with call bell/phone within reach  OT Visit Diagnosis: Unsteadiness on feet (R26.81);Other abnormalities of gait and mobility (R26.89);Muscle weakness (generalized) (M62.81)                Time: 7544-9201 OT Time Calculation (min): 20 min Charges:  OT General Charges $OT Visit: 1 Visit OT Evaluation $OT Eval Low Complexity: 1 Low  Duana Benedict MSOT, OTR/L Acute Rehab Office: [place] 01/01/2022, 8:28 AM

## 2022-01-01 NOTE — Progress Notes (Signed)
Patient transported to her vehicle by volunteer via wheelchair for discharge home; in no acute distress nor complaints of pain nor discomfort; moves all extremities well; incision on her back with Aquacel dressing and is clean, dry and intact; room was checked for all her belongings; discharge instructions concerning her medications, incision care, follow up appointment and when to call the doctor as needed were all discussed with patient and her husband by RN and both expressed understanding on the instructions given.

## 2022-01-01 NOTE — Progress Notes (Signed)
Physical Therapy Treatment & Discharge Patient Details Name: [name] MRN: [id number] DOB: 02-28-1946 Today's Date: 01/01/2022   History of Present Illness Pt is a 76 y.o. female s/p L3-4 microlumbar decompression with bilateral laminotomies and foraminotomies 12/31/21. PMH includes HTN, IBS, HLD, hypothyroidism.   PT Comments    Pt progressing with mobility. Pt reports preference for RW to ambulate longer distances; moving well at supervision-level for gait and stair training. Reviewed educ re: precautions, positioning, activity recommendations. Pt preparing for d/c home today, reports no further questions or concerns. Will d/c acute PT.    Recommendations for follow up therapy are one component of a multi-disciplinary discharge planning process, led by the attending physician.  Recommendations may be updated based on patient status, additional functional criteria and insurance authorization.  Follow Up Recommendations  No PT follow up     Assistance Recommended at Discharge PRN  Patient can return home with the following A little help with bathing/dressing/bathroom;Assistance with cooking/housework;Assist for transportation;Help with stairs or ramp for entrance   Equipment Recommendations  Youth-sized rolling walker (2 wheels)    Recommendations for Other Services       Precautions / Restrictions Precautions Precautions: Back Precaution Booklet Issued: Yes (comment) Precaution Comments: reviewed handout Required Braces or Orthoses: Other Brace Other Brace: No brace per MD Restrictions Weight Bearing Restrictions: No     Mobility  Bed Mobility               General bed mobility comments: received standing in room    Transfers Overall transfer level: Modified independent Equipment used: None, Rolling walker (2 wheels) Transfers: Sit to/from Stand                  Ambulation/Gait Ambulation/Gait assistance: Supervision Gait Distance (Feet): 320  Feet Assistive device: Rolling walker (2 wheels) Gait Pattern/deviations: Step-through pattern, Decreased stride length Gait velocity: Decreased     General Gait Details: pt ambulating in room without DME, reaching to furniture for support; reports preference for RW for hallway ambulation, moving well with RW at [place]: Yes Stairs assistance: Supervision Stair Management: One rail Left, Step to pattern, Forwards Number of Stairs: 3 General stair comments: ascend/descend 3 steps with rail support; supervision for safety   Wheelchair Mobility    Modified Rankin (Stroke Patients Only)       Balance Overall balance assessment: Needs assistance   Sitting balance-Leahy Scale: Good     Standing balance support: No upper extremity supported, During functional activity Standing balance-Leahy Scale: Fair Standing balance comment: can ambulate without UE support, preference for RW                            Cognition Arousal/Alertness: Awake/alert Behavior During Therapy: WFL for tasks assessed/performed Overall Cognitive Status: Within Functional Limits for tasks assessed                                          Exercises      General Comments General comments (skin integrity, edema, etc.): reviewed educ re: precautions, positioning, DME needs, fall risk reduction, activity recommendations      Pertinent Vitals/Pain Pain Assessment Pain Assessment: Faces Faces Pain Scale: Hurts a little bit Pain Location: back Pain Descriptors / Indicators: Discomfort Pain Intervention(s): Monitored during session    Home Living Family/patient expects to  be discharged to:: Private residence Living Arrangements: Spouse/significant other Available Help at Discharge: Family;Available 24 hours/day Type of Home: House Home Access: Stairs to enter Entrance Stairs-Rails: None Entrance Stairs-Number of Steps: 3   Home Layout: Two  level;Full bath on main level;Able to live on main level with bedroom/bathroom Home Equipment: Shower seat      Prior Function            PT Goals (current goals can now be found in the care plan section) Progress towards PT goals: Goals met/education completed, patient discharged from PT    Frequency    7X/week      PT Plan Current plan remains appropriate    Co-evaluation              AM-PAC PT "6 Clicks" Mobility   Outcome Measure  Help needed turning from your back to your side while in a flat bed without using bedrails?: None Help needed moving from lying on your back to sitting on the side of a flat bed without using bedrails?: None Help needed moving to and from a bed to a chair (including a wheelchair)?: None Help needed standing up from a chair using your arms (e.g., wheelchair or bedside chair)?: None Help needed to walk in hospital room?: A Little Help needed climbing 3-5 steps with a railing? : A Little 6 Click Score: 22    End of Session   Activity Tolerance: Patient tolerated treatment well   Nurse Communication: Mobility status PT Visit Diagnosis: Other abnormalities of gait and mobility (R26.89);Muscle weakness (generalized) (M62.81);Difficulty in walking, not elsewhere classified (R26.2);Pain     Time: 0071-2197 PT Time Calculation (min) (ACUTE ONLY): 12 min  Charges:  $Gait Training: 8-22 mins                     Mabeline Caras, PT, DPT Acute Rehabilitation Services  Personal: [facility] Office: [place] 01/01/2022, 9:17 AM

## 2022-02-08 ENCOUNTER — Other Ambulatory Visit: Payer: Self-pay | Admitting: Obstetrics and Gynecology

## 2022-02-08 DIAGNOSIS — Z1231 Encounter for screening mammogram for malignant neoplasm of breast: Secondary | ICD-10-CM

## 2022-02-22 DIAGNOSIS — Z4889 Encounter for other specified surgical aftercare: Secondary | ICD-10-CM | POA: Diagnosis not present

## 2022-03-05 ENCOUNTER — Ambulatory Visit: Payer: Medicare Other

## 2022-04-26 DIAGNOSIS — J019 Acute sinusitis, unspecified: Secondary | ICD-10-CM | POA: Diagnosis not present

## 2022-04-26 DIAGNOSIS — J310 Chronic rhinitis: Secondary | ICD-10-CM | POA: Diagnosis not present

## 2022-05-31 DIAGNOSIS — H35373 Puckering of macula, bilateral: Secondary | ICD-10-CM | POA: Diagnosis not present

## 2022-05-31 DIAGNOSIS — H1840 Unspecified corneal degeneration: Secondary | ICD-10-CM | POA: Diagnosis not present

## 2022-05-31 DIAGNOSIS — H04123 Dry eye syndrome of bilateral lacrimal glands: Secondary | ICD-10-CM | POA: Diagnosis not present

## 2022-05-31 DIAGNOSIS — H40013 Open angle with borderline findings, low risk, bilateral: Secondary | ICD-10-CM | POA: Diagnosis not present

## 2022-05-31 DIAGNOSIS — H524 Presbyopia: Secondary | ICD-10-CM | POA: Diagnosis not present

## 2022-07-20 DIAGNOSIS — R0981 Nasal congestion: Secondary | ICD-10-CM | POA: Diagnosis not present

## 2022-07-20 DIAGNOSIS — R051 Acute cough: Secondary | ICD-10-CM | POA: Diagnosis not present

## 2022-07-20 DIAGNOSIS — J0100 Acute maxillary sinusitis, unspecified: Secondary | ICD-10-CM | POA: Diagnosis not present

## 2022-07-20 DIAGNOSIS — R519 Headache, unspecified: Secondary | ICD-10-CM | POA: Diagnosis not present

## 2022-07-28 DIAGNOSIS — M542 Cervicalgia: Secondary | ICD-10-CM | POA: Diagnosis not present

## 2022-07-28 DIAGNOSIS — H04123 Dry eye syndrome of bilateral lacrimal glands: Secondary | ICD-10-CM | POA: Diagnosis not present

## 2022-07-28 DIAGNOSIS — M5451 Vertebrogenic low back pain: Secondary | ICD-10-CM | POA: Diagnosis not present

## 2022-07-28 DIAGNOSIS — H169 Unspecified keratitis: Secondary | ICD-10-CM | POA: Diagnosis not present

## 2022-08-04 ENCOUNTER — Encounter (INDEPENDENT_AMBULATORY_CARE_PROVIDER_SITE_OTHER): Payer: Medicare Other | Admitting: Ophthalmology

## 2022-08-04 DIAGNOSIS — H35033 Hypertensive retinopathy, bilateral: Secondary | ICD-10-CM

## 2022-08-04 DIAGNOSIS — H43813 Vitreous degeneration, bilateral: Secondary | ICD-10-CM | POA: Diagnosis not present

## 2022-08-04 DIAGNOSIS — I10 Essential (primary) hypertension: Secondary | ICD-10-CM

## 2022-08-04 DIAGNOSIS — H35343 Macular cyst, hole, or pseudohole, bilateral: Secondary | ICD-10-CM

## 2022-08-04 DIAGNOSIS — H35373 Puckering of macula, bilateral: Secondary | ICD-10-CM

## 2022-08-19 DIAGNOSIS — H04123 Dry eye syndrome of bilateral lacrimal glands: Secondary | ICD-10-CM | POA: Diagnosis not present

## 2022-08-19 DIAGNOSIS — H16223 Keratoconjunctivitis sicca, not specified as Sjogren's, bilateral: Secondary | ICD-10-CM | POA: Diagnosis not present

## 2022-11-23 DIAGNOSIS — G4700 Insomnia, unspecified: Secondary | ICD-10-CM | POA: Diagnosis not present

## 2022-11-23 DIAGNOSIS — Z780 Asymptomatic menopausal state: Secondary | ICD-10-CM | POA: Diagnosis not present

## 2022-11-23 DIAGNOSIS — E782 Mixed hyperlipidemia: Secondary | ICD-10-CM | POA: Diagnosis not present

## 2022-11-23 DIAGNOSIS — E559 Vitamin D deficiency, unspecified: Secondary | ICD-10-CM | POA: Diagnosis not present

## 2022-11-23 DIAGNOSIS — E039 Hypothyroidism, unspecified: Secondary | ICD-10-CM | POA: Diagnosis not present

## 2022-11-23 DIAGNOSIS — I10 Essential (primary) hypertension: Secondary | ICD-10-CM | POA: Diagnosis not present

## 2022-11-23 DIAGNOSIS — Z0000 Encounter for general adult medical examination without abnormal findings: Secondary | ICD-10-CM | POA: Diagnosis not present

## 2022-11-23 DIAGNOSIS — Z79899 Other long term (current) drug therapy: Secondary | ICD-10-CM | POA: Diagnosis not present

## 2022-12-30 ENCOUNTER — Emergency Department (HOSPITAL_COMMUNITY): Payer: No Typology Code available for payment source

## 2022-12-30 ENCOUNTER — Encounter (HOSPITAL_COMMUNITY): Payer: Self-pay | Admitting: Emergency Medicine

## 2022-12-30 ENCOUNTER — Other Ambulatory Visit: Payer: Self-pay

## 2022-12-30 ENCOUNTER — Inpatient Hospital Stay (HOSPITAL_COMMUNITY)
Admission: EM | Admit: 2022-12-30 | Discharge: 2023-01-04 | DRG: 184 | Disposition: A | Discharge status: Home Health | Payer: No Typology Code available for payment source | Attending: General Surgery | Admitting: General Surgery

## 2022-12-30 DIAGNOSIS — E039 Hypothyroidism, unspecified: Secondary | ICD-10-CM | POA: Diagnosis present

## 2022-12-30 DIAGNOSIS — R1084 Generalized abdominal pain: Secondary | ICD-10-CM | POA: Diagnosis not present

## 2022-12-30 DIAGNOSIS — N179 Acute kidney failure, unspecified: Secondary | ICD-10-CM | POA: Diagnosis not present

## 2022-12-30 DIAGNOSIS — Z825 Family history of asthma and other chronic lower respiratory diseases: Secondary | ICD-10-CM

## 2022-12-30 DIAGNOSIS — Z96652 Presence of left artificial knee joint: Secondary | ICD-10-CM | POA: Diagnosis not present

## 2022-12-30 DIAGNOSIS — S270XXA Traumatic pneumothorax, initial encounter: Secondary | ICD-10-CM | POA: Diagnosis not present

## 2022-12-30 DIAGNOSIS — K5790 Diverticulosis of intestine, part unspecified, without perforation or abscess without bleeding: Secondary | ICD-10-CM | POA: Diagnosis not present

## 2022-12-30 DIAGNOSIS — S299XXA Unspecified injury of thorax, initial encounter: Secondary | ICD-10-CM | POA: Diagnosis not present

## 2022-12-30 DIAGNOSIS — I10 Essential (primary) hypertension: Secondary | ICD-10-CM | POA: Diagnosis present

## 2022-12-30 DIAGNOSIS — K219 Gastro-esophageal reflux disease without esophagitis: Secondary | ICD-10-CM | POA: Diagnosis present

## 2022-12-30 DIAGNOSIS — Z91048 Other nonmedicinal substance allergy status: Secondary | ICD-10-CM | POA: Diagnosis not present

## 2022-12-30 DIAGNOSIS — Z8249 Family history of ischemic heart disease and other diseases of the circulatory system: Secondary | ICD-10-CM | POA: Diagnosis not present

## 2022-12-30 DIAGNOSIS — Z9049 Acquired absence of other specified parts of digestive tract: Secondary | ICD-10-CM | POA: Diagnosis not present

## 2022-12-30 DIAGNOSIS — R110 Nausea: Secondary | ICD-10-CM | POA: Diagnosis not present

## 2022-12-30 DIAGNOSIS — Z961 Presence of intraocular lens: Secondary | ICD-10-CM | POA: Diagnosis not present

## 2022-12-30 DIAGNOSIS — Z8616 Personal history of COVID-19: Secondary | ICD-10-CM | POA: Diagnosis not present

## 2022-12-30 DIAGNOSIS — J45909 Unspecified asthma, uncomplicated: Secondary | ICD-10-CM | POA: Diagnosis not present

## 2022-12-30 DIAGNOSIS — Z885 Allergy status to narcotic agent status: Secondary | ICD-10-CM | POA: Diagnosis not present

## 2022-12-30 DIAGNOSIS — Z90710 Acquired absence of both cervix and uterus: Secondary | ICD-10-CM

## 2022-12-30 DIAGNOSIS — S3993XA Unspecified injury of pelvis, initial encounter: Secondary | ICD-10-CM | POA: Diagnosis not present

## 2022-12-30 DIAGNOSIS — M50323 Other cervical disc degeneration at C6-C7 level: Secondary | ICD-10-CM | POA: Diagnosis not present

## 2022-12-30 DIAGNOSIS — S2242XA Multiple fractures of ribs, left side, initial encounter for closed fracture: Principal | ICD-10-CM | POA: Diagnosis present

## 2022-12-30 DIAGNOSIS — T07XXXA Unspecified multiple injuries, initial encounter: Secondary | ICD-10-CM

## 2022-12-30 DIAGNOSIS — M545 Low back pain, unspecified: Secondary | ICD-10-CM | POA: Diagnosis not present

## 2022-12-30 DIAGNOSIS — J9859 Other diseases of mediastinum, not elsewhere classified: Secondary | ICD-10-CM | POA: Diagnosis not present

## 2022-12-30 DIAGNOSIS — S99912A Unspecified injury of left ankle, initial encounter: Secondary | ICD-10-CM | POA: Diagnosis not present

## 2022-12-30 DIAGNOSIS — J208 Acute bronchitis due to other specified organisms: Secondary | ICD-10-CM | POA: Diagnosis not present

## 2022-12-30 DIAGNOSIS — S32018A Other fracture of first lumbar vertebra, initial encounter for closed fracture: Secondary | ICD-10-CM | POA: Diagnosis present

## 2022-12-30 DIAGNOSIS — R0989 Other specified symptoms and signs involving the circulatory and respiratory systems: Secondary | ICD-10-CM | POA: Diagnosis not present

## 2022-12-30 DIAGNOSIS — Z83438 Family history of other disorder of lipoprotein metabolism and other lipidemia: Secondary | ICD-10-CM | POA: Diagnosis not present

## 2022-12-30 DIAGNOSIS — M48061 Spinal stenosis, lumbar region without neurogenic claudication: Secondary | ICD-10-CM | POA: Diagnosis present

## 2022-12-30 DIAGNOSIS — S8992XA Unspecified injury of left lower leg, initial encounter: Secondary | ICD-10-CM | POA: Diagnosis not present

## 2022-12-30 DIAGNOSIS — E785 Hyperlipidemia, unspecified: Secondary | ICD-10-CM | POA: Diagnosis present

## 2022-12-30 DIAGNOSIS — J189 Pneumonia, unspecified organism: Secondary | ICD-10-CM | POA: Diagnosis not present

## 2022-12-30 DIAGNOSIS — R0689 Other abnormalities of breathing: Secondary | ICD-10-CM | POA: Diagnosis not present

## 2022-12-30 DIAGNOSIS — Z888 Allergy status to other drugs, medicaments and biological substances status: Secondary | ICD-10-CM | POA: Diagnosis not present

## 2022-12-30 DIAGNOSIS — R0789 Other chest pain: Secondary | ICD-10-CM | POA: Diagnosis not present

## 2022-12-30 DIAGNOSIS — Z79890 Hormone replacement therapy (postmenopausal): Secondary | ICD-10-CM | POA: Diagnosis not present

## 2022-12-30 DIAGNOSIS — M47812 Spondylosis without myelopathy or radiculopathy, cervical region: Secondary | ICD-10-CM | POA: Diagnosis not present

## 2022-12-30 DIAGNOSIS — Z7982 Long term (current) use of aspirin: Secondary | ICD-10-CM

## 2022-12-30 DIAGNOSIS — M26643 Arthritis of bilateral temporomandibular joint: Secondary | ICD-10-CM | POA: Diagnosis not present

## 2022-12-30 DIAGNOSIS — R609 Edema, unspecified: Secondary | ICD-10-CM | POA: Diagnosis not present

## 2022-12-30 DIAGNOSIS — R070 Pain in throat: Secondary | ICD-10-CM | POA: Diagnosis not present

## 2022-12-30 DIAGNOSIS — R071 Chest pain on breathing: Secondary | ICD-10-CM | POA: Diagnosis not present

## 2022-12-30 DIAGNOSIS — I672 Cerebral atherosclerosis: Secondary | ICD-10-CM | POA: Diagnosis not present

## 2022-12-30 LAB — COMPREHENSIVE METABOLIC PANEL
ALT: 22 U/L (ref 0–44)
AST: 47 U/L — ABNORMAL HIGH (ref 15–41)
Albumin: 3.5 g/dL (ref 3.5–5.0)
Alkaline Phosphatase: 74 U/L (ref 38–126)
Anion gap: 15 (ref 5–15)
BUN: 9 mg/dL (ref 8–23)
CO2: 23 mmol/L (ref 22–32)
Calcium: 9 mg/dL (ref 8.9–10.3)
Chloride: 100 mmol/L (ref 98–111)
Creatinine, Ser: 1.18 mg/dL — ABNORMAL HIGH (ref 0.44–1.00)
GFR, Estimated: 48 mL/min — ABNORMAL LOW (ref >60)
Glucose, Bld: 114 mg/dL — ABNORMAL HIGH (ref 70–99)
Potassium: 3.4 mmol/L — ABNORMAL LOW (ref 3.5–5.1)
Sodium: 138 mmol/L (ref 135–145)
Total Bilirubin: 0.3 mg/dL (ref 0.3–1.2)
Total Protein: 6.5 g/dL (ref 6.5–8.1)

## 2022-12-30 LAB — CBC WITH DIFFERENTIAL/PLATELET
Abs Immature Granulocytes: 0.13 10*3/uL — ABNORMAL HIGH (ref 0.00–0.07)
Basophils Absolute: 0.1 10*3/uL (ref 0.0–0.1)
Basophils Relative: 0 %
Eosinophils Absolute: 0.1 10*3/uL (ref 0.0–0.5)
Eosinophils Relative: 1 %
HCT: 33.7 % — ABNORMAL LOW (ref 36.0–46.0)
Hemoglobin: 10.6 g/dL — ABNORMAL LOW (ref 12.0–15.0)
Immature Granulocytes: 1 %
Lymphocytes Relative: 16 %
Lymphs Abs: 2 10*3/uL (ref 0.7–4.0)
MCH: 26.4 pg (ref 26.0–34.0)
MCHC: 31.5 g/dL (ref 30.0–36.0)
MCV: 83.8 fL (ref 80.0–100.0)
Monocytes Absolute: 0.7 10*3/uL (ref 0.1–1.0)
Monocytes Relative: 5 %
Neutro Abs: 9.2 10*3/uL — ABNORMAL HIGH (ref 1.7–7.7)
Neutrophils Relative %: 77 %
Platelets: 220 10*3/uL (ref 150–400)
RBC: 4.02 MIL/uL (ref 3.87–5.11)
RDW: 13.9 % (ref 11.5–15.5)
WBC: 12.1 10*3/uL — ABNORMAL HIGH (ref 4.0–10.5)
nRBC: 0 % (ref 0.0–0.2)

## 2022-12-30 LAB — ETHANOL: Alcohol, Ethyl (B): <10 mg/dL (ref <10)

## 2022-12-30 LAB — LACTIC ACID, PLASMA: Lactic Acid, Venous: 1.7 mmol/L (ref 0.5–1.9)

## 2022-12-30 LAB — PROTIME-INR
INR: 1.1 (ref 0.8–1.2)
Prothrombin Time: 14.1 seconds (ref 11.4–15.2)

## 2022-12-30 MED ORDER — LATANOPROST 0.005 % OP SOLN
1.0000 [drp] | Freq: Every day | OPHTHALMIC | Status: DC
  Administered 2022-12-31 – 2023-01-03 (×5): 1 [drp] via OPHTHALMIC
  Filled 2022-12-30: qty 2.5

## 2022-12-30 MED ORDER — OXYCODONE HCL 5 MG PO TABS: 5.0000 mg | ORAL_TABLET | ORAL | Status: DC | PRN

## 2022-12-30 MED ORDER — OXYCODONE HCL 5 MG PO TABS
10.0000 mg | ORAL_TABLET | ORAL | Status: DC | PRN
  Administered 2022-12-31 – 2023-01-01 (×2): 10 mg via ORAL
  Filled 2022-12-30 (×2): qty 2

## 2022-12-30 MED ORDER — DIPHENHYDRAMINE HCL 25 MG PO CAPS: 25.0000 mg | ORAL_CAPSULE | Freq: Every evening | ORAL | Status: DC | PRN

## 2022-12-30 MED ORDER — POLYVINYL ALCOHOL 1.4 % OP SOLN
1.0000 [drp] | Freq: Four times a day (QID) | OPHTHALMIC | Status: DC
  Administered 2022-12-31 – 2023-01-04 (×16): 1 [drp] via OPHTHALMIC
  Filled 2022-12-30: qty 15

## 2022-12-30 MED ORDER — TRIAMTERENE-HCTZ 37.5-25 MG PO TABS
1.0000 | ORAL_TABLET | Freq: Every morning | ORAL | Status: DC
  Administered 2023-01-01 – 2023-01-04 (×4): 1 via ORAL
  Filled 2022-12-30 (×6): qty 1

## 2022-12-30 MED ORDER — HYDROMORPHONE HCL 1 MG/ML IJ SOLN
0.5000 mg | INTRAMUSCULAR | Status: DC | PRN
  Administered 2022-12-30 – 2022-12-31 (×6): 0.5 mg via INTRAVENOUS
  Filled 2022-12-30 (×6): qty 0.5

## 2022-12-30 MED ORDER — ONDANSETRON HCL 4 MG/2ML IJ SOLN
4.0000 mg | Freq: Four times a day (QID) | INTRAMUSCULAR | Status: DC | PRN
  Administered 2023-01-02: 4 mg via INTRAVENOUS
  Filled 2022-12-30: qty 2

## 2022-12-30 MED ORDER — PANTOPRAZOLE SODIUM 20 MG PO TBEC
20.0000 mg | DELAYED_RELEASE_TABLET | Freq: Two times a day (BID) | ORAL | Status: DC
  Administered 2022-12-30 – 2023-01-04 (×10): 20 mg via ORAL
  Filled 2022-12-30 (×10): qty 1

## 2022-12-30 MED ORDER — DOCUSATE SODIUM 100 MG PO CAPS
100.0000 mg | ORAL_CAPSULE | Freq: Two times a day (BID) | ORAL | Status: DC
  Administered 2022-12-30 – 2023-01-04 (×9): 100 mg via ORAL
  Filled 2022-12-30 (×10): qty 1

## 2022-12-30 MED ORDER — GABAPENTIN 300 MG PO CAPS
300.0000 mg | ORAL_CAPSULE | Freq: Three times a day (TID) | ORAL | Status: DC
  Administered 2022-12-30 – 2023-01-04 (×14): 300 mg via ORAL
  Filled 2022-12-30 (×13): qty 1

## 2022-12-30 MED ORDER — CITALOPRAM HYDROBROMIDE 20 MG PO TABS
20.0000 mg | ORAL_TABLET | Freq: Every evening | ORAL | Status: DC
  Administered 2022-12-30 – 2023-01-03 (×5): 20 mg via ORAL
  Filled 2022-12-30 (×5): qty 1

## 2022-12-30 MED ORDER — ASPIRIN 81 MG PO TBEC
81.0000 mg | DELAYED_RELEASE_TABLET | Freq: Every day | ORAL | Status: DC
  Administered 2022-12-31 – 2023-01-04 (×5): 81 mg via ORAL
  Filled 2022-12-30 (×5): qty 1

## 2022-12-30 MED ORDER — METOPROLOL TARTRATE 5 MG/5ML IV SOLN: 5.0000 mg | Freq: Four times a day (QID) | INTRAVENOUS | Status: DC | PRN

## 2022-12-30 MED ORDER — IOHEXOL 350 MG/ML SOLN
100.0000 mL | Freq: Once | INTRAVENOUS | Status: AC | PRN
  Administered 2022-12-30: 100 mL via INTRAVENOUS

## 2022-12-30 MED ORDER — HYDRALAZINE HCL 20 MG/ML IJ SOLN: 10.0000 mg | INTRAMUSCULAR | Status: DC | PRN

## 2022-12-30 MED ORDER — FENTANYL CITRATE PF 50 MCG/ML IJ SOSY
50.0000 ug | PREFILLED_SYRINGE | Freq: Once | INTRAMUSCULAR | Status: AC
  Administered 2022-12-30: 50 ug via INTRAVENOUS
  Filled 2022-12-30: qty 1

## 2022-12-30 MED ORDER — CYCLOSPORINE 0.05 % OP EMUL
1.0000 [drp] | Freq: Two times a day (BID) | OPHTHALMIC | Status: DC
  Administered 2022-12-31 – 2023-01-04 (×9): 1 [drp] via OPHTHALMIC
  Filled 2022-12-30 (×12): qty 30

## 2022-12-30 MED ORDER — ZOLPIDEM TARTRATE 5 MG PO TABS
5.0000 mg | ORAL_TABLET | Freq: Every evening | ORAL | Status: DC | PRN
  Administered 2022-12-31 – 2023-01-03 (×2): 5 mg via ORAL
  Filled 2022-12-30 (×2): qty 1

## 2022-12-30 MED ORDER — ACETAMINOPHEN 500 MG PO TABS
1000.0000 mg | ORAL_TABLET | Freq: Four times a day (QID) | ORAL | Status: DC
  Administered 2022-12-31 – 2023-01-01 (×7): 1000 mg via ORAL
  Filled 2022-12-30 (×7): qty 2

## 2022-12-30 MED ORDER — POLYETHYLENE GLYCOL 3350 17 G PO PACK: 17.0000 g | PACK | Freq: Every day | ORAL | Status: DC | PRN

## 2022-12-30 MED ORDER — ONDANSETRON 4 MG PO TBDP
4.0000 mg | ORAL_TABLET | Freq: Four times a day (QID) | ORAL | Status: DC | PRN
  Administered 2023-01-01: 4 mg via ORAL
  Filled 2022-12-30: qty 1

## 2022-12-30 MED ORDER — ENOXAPARIN SODIUM 30 MG/0.3ML IJ SOSY
30.0000 mg | PREFILLED_SYRINGE | Freq: Two times a day (BID) | INTRAMUSCULAR | Status: DC
  Administered 2022-12-31 – 2023-01-04 (×9): 30 mg via SUBCUTANEOUS
  Filled 2022-12-30 (×9): qty 0.3

## 2022-12-30 MED ORDER — POLYETHYLENE GLYCOL 3350 17 G PO PACK
17.0000 g | PACK | Freq: Every day | ORAL | Status: DC
  Administered 2022-12-31 – 2023-01-04 (×4): 17 g via ORAL
  Filled 2022-12-30 (×5): qty 1

## 2022-12-30 MED ORDER — BUPROPION HCL ER (SR) 150 MG PO TB12
150.0000 mg | ORAL_TABLET | Freq: Every morning | ORAL | Status: DC
  Administered 2022-12-31 – 2023-01-04 (×5): 150 mg via ORAL
  Filled 2022-12-30 (×7): qty 1

## 2022-12-30 MED ORDER — DOCUSATE SODIUM 100 MG PO CAPS: 200.0000 mg | ORAL_CAPSULE | Freq: Two times a day (BID) | ORAL | Status: DC | PRN

## 2022-12-30 MED ORDER — COLESTIPOL HCL 1 G PO TABS: 1.0000 g | ORAL_TABLET | Freq: Two times a day (BID) | ORAL | Status: DC | PRN
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
## 2022-12-30 NOTE — Plan of Care (Signed)
  Problem: Education: Goal: Knowledge of General Education information will improve Description: Including pain rating scale, medication(s)/side effects and non-pharmacologic comfort measures Outcome: Progressing   Problem: Clinical Measurements: Goal: Will remain free from infection Outcome: Progressing   Problem: Nutrition: Goal: Adequate nutrition will be maintained Outcome: Progressing   
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
## 2022-12-30 NOTE — TOC CAGE-AID Note (Signed)
Transition of Care [facility]) - CAGE-AID Screening   Patient Details  Name: [name] MRN: [id number] Date of Birth: 03-16-1946  Transition of Care [facility]) CM/SW Contact:    Katha Hamming, RN Phone Number: 12/30/2022, 8:18 PM   CAGE-AID Screening:    Have You Ever Felt You Ought to Cut Down on Your Drinking or Drug Use?: No Have People Annoyed You By Critizing Your Drinking Or Drug Use?: No Have You Felt Bad Or Guilty About Your Drinking Or Drug Use?: No Have You Ever Had a Drink or Used Drugs First Thing In The Morning to Steady Your Nerves or to Get Rid of a Hangover?: No CAGE-AID Score: 0  Substance Abuse Education Offered: No

## 2022-12-30 NOTE — Progress Notes (Signed)
Pt arrived to 6n31 from ED by stretcher. Unable to move self into bed. C/O neck and left hand discomfort. Denies nausea. Daughter in law at bedside.
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
## 2022-12-31 LAB — CBC
HCT: 31.9 % — ABNORMAL LOW (ref 36.0–46.0)
Hemoglobin: 10.1 g/dL — ABNORMAL LOW (ref 12.0–15.0)
MCH: 26.5 pg (ref 26.0–34.0)
MCHC: 31.7 g/dL (ref 30.0–36.0)
MCV: 83.7 fL (ref 80.0–100.0)
Platelets: 217 10*3/uL (ref 150–400)
RBC: 3.81 MIL/uL — ABNORMAL LOW (ref 3.87–5.11)
RDW: 14 % (ref 11.5–15.5)
WBC: 9.8 10*3/uL (ref 4.0–10.5)
nRBC: 0 % (ref 0.0–0.2)

## 2022-12-31 LAB — BASIC METABOLIC PANEL
Anion gap: 11 (ref 5–15)
BUN: 9 mg/dL (ref 8–23)
CO2: 24 mmol/L (ref 22–32)
Calcium: 8.5 mg/dL — ABNORMAL LOW (ref 8.9–10.3)
Chloride: 100 mmol/L (ref 98–111)
Creatinine, Ser: 1.17 mg/dL — ABNORMAL HIGH (ref 0.44–1.00)
GFR, Estimated: 48 mL/min — ABNORMAL LOW (ref >60)
Glucose, Bld: 128 mg/dL — ABNORMAL HIGH (ref 70–99)
Potassium: 3.7 mmol/L (ref 3.5–5.1)
Sodium: 135 mmol/L (ref 135–145)

## 2022-12-31 MED ORDER — IPRATROPIUM-ALBUTEROL 0.5-2.5 (3) MG/3ML IN SOLN: 3.0000 mL | Freq: Four times a day (QID) | RESPIRATORY_TRACT | Status: DC | PRN

## 2022-12-31 MED ORDER — SODIUM CHLORIDE 0.9 % IV SOLN: INTRAVENOUS | Status: DC

## 2022-12-31 MED ORDER — LIDOCAINE 5 % EX PTCH
1.0000 | MEDICATED_PATCH | Freq: Every day | CUTANEOUS | Status: DC
  Administered 2022-12-31 – 2023-01-04 (×5): 1 via TRANSDERMAL
  Filled 2022-12-31 (×5): qty 1

## 2022-12-31 MED ORDER — GUAIFENESIN ER 600 MG PO TB12
600.0000 mg | ORAL_TABLET | Freq: Two times a day (BID) | ORAL | Status: DC
  Administered 2022-12-31 – 2023-01-04 (×7): 600 mg via ORAL
  Filled 2022-12-31 (×8): qty 1

## 2022-12-31 MED ORDER — METHOCARBAMOL 500 MG PO TABS
500.0000 mg | ORAL_TABLET | Freq: Three times a day (TID) | ORAL | Status: DC
  Administered 2022-12-31 – 2023-01-04 (×13): 500 mg via ORAL
  Filled 2022-12-31 (×13): qty 1

## 2022-12-31 NOTE — Progress Notes (Signed)
PT Cancellation Note  Patient Details Name: [name] MRN: [id number] DOB: 1946-03-01   Cancelled Treatment:    Reason Eval/Treat Not Completed: Patient declined, no reason specified (Pt states that it's been an hour and 26 minutes since she asked for pain meds and will not be doing physical therapy today. Will try again later if time allows.)   [name] 12/31/2022, 9:39 AM
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
## 2023-01-01 MED ORDER — SIMETHICONE 80 MG PO CHEW
80.0000 mg | CHEWABLE_TABLET | Freq: Four times a day (QID) | ORAL | Status: DC | PRN
  Administered 2023-01-01 – 2023-01-02 (×3): 80 mg via ORAL
  Filled 2023-01-01 (×3): qty 1

## 2023-01-01 MED ORDER — HYDROMORPHONE HCL 1 MG/ML IJ SOLN
0.5000 mg | INTRAMUSCULAR | Status: DC | PRN
  Administered 2023-01-01 – 2023-01-02 (×2): 0.5 mg via INTRAVENOUS
  Filled 2023-01-01 (×2): qty 0.5
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
## 2023-01-01 NOTE — Progress Notes (Addendum)
Pt complaining of RQ abd pain, abd slightly firm, passing gas, vomited after morning medications, and has nausea now. Alerted MD. Rhina Brackett second page with same message. No response. Sent third page at 18:47 with same message but abd is not considerably distended. Plus pt is hallucinating on oxycodone and would like a different pain med. Waiting for response.

## 2023-01-02 MED ORDER — MAGNESIUM HYDROXIDE 400 MG/5ML PO SUSP
30.0000 mL | Freq: Once | ORAL | Status: AC
  Administered 2023-01-02: 30 mL via ORAL
  Filled 2023-01-02: qty 30

## 2023-01-02 MED ORDER — MAGNESIUM CITRATE PO SOLN
1.0000 | Freq: Once | ORAL | Status: DC
  Filled 2023-01-02: qty 296

## 2023-01-02 MED ORDER — HYDROCODONE-ACETAMINOPHEN 5-325 MG PO TABS
1.0000 | ORAL_TABLET | ORAL | Status: DC | PRN
  Administered 2023-01-02 – 2023-01-04 (×7): 2 via ORAL
  Filled 2023-01-02 (×8): qty 2

## 2023-01-02 MED ORDER — SENNA 8.6 MG PO TABS
2.0000 | ORAL_TABLET | Freq: Once | ORAL | Status: AC
  Administered 2023-01-02: 17.2 mg via ORAL
  Filled 2023-01-02: qty 2

## 2023-01-02 MED ORDER — BISACODYL 10 MG RE SUPP
10.0000 mg | Freq: Once | RECTAL | Status: DC
  Filled 2023-01-02: qty 1

## 2023-01-02 MED ORDER — BISACODYL 5 MG PO TBEC
10.0000 mg | DELAYED_RELEASE_TABLET | Freq: Once | ORAL | Status: AC
  Administered 2023-01-02: 10 mg via ORAL
  Filled 2023-01-02: qty 2
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
## 2023-01-03 LAB — BASIC METABOLIC PANEL
Anion gap: 11 (ref 5–15)
BUN: 8 mg/dL (ref 8–23)
CO2: 26 mmol/L (ref 22–32)
Calcium: 8.4 mg/dL — ABNORMAL LOW (ref 8.9–10.3)
Chloride: 98 mmol/L (ref 98–111)
Creatinine, Ser: 1.06 mg/dL — ABNORMAL HIGH (ref 0.44–1.00)
GFR, Estimated: 54 mL/min — ABNORMAL LOW (ref >60)
Glucose, Bld: 109 mg/dL — ABNORMAL HIGH (ref 70–99)
Potassium: 3.2 mmol/L — ABNORMAL LOW (ref 3.5–5.1)
Sodium: 135 mmol/L (ref 135–145)

## 2023-01-03 MED ORDER — BISACODYL 10 MG RE SUPP
10.0000 mg | Freq: Once | RECTAL | Status: AC
  Administered 2023-01-03: 10 mg via RECTAL
  Filled 2023-01-03: qty 1

## 2023-01-03 MED ORDER — POTASSIUM CHLORIDE CRYS ER 20 MEQ PO TBCR
40.0000 meq | EXTENDED_RELEASE_TABLET | Freq: Four times a day (QID) | ORAL | Status: AC
  Administered 2023-01-03 (×2): 40 meq via ORAL
  Filled 2023-01-03 (×2): qty 2
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
## 2023-01-03 NOTE — Care Management Important Message (Signed)
Important Message  Patient Details  Name: [name] MRN: [id number] Date of Birth: [birth year]   Medicare Important Message Given:  Yes     Sherilyn Banker 01/03/2023, 3:55 PM
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
## 2023-01-03 NOTE — Progress Notes (Signed)
Patient used BSC, observed a moderate amount of bloody urine with blood clots, no stool noted.  Patient denied straining while voiding, Notified Barnetta Chapel, PA.  Patient voided following this occurrence and no blood was noted, will monitor for changes.
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
## 2023-01-03 NOTE — Progress Notes (Addendum)
Walked into patient's room and patient was satting at 80% on room air.  Patient was alert and oriented, no changes in baseline.  Reapplied Nasal Cannula at 2L, patient rebounded to 96%.  Notified Dr. Andrey Campanile, due to patient having orders to wean off oxygen, will monitor for changes.

## 2023-01-04 ENCOUNTER — Other Ambulatory Visit (HOSPITAL_COMMUNITY): Payer: Self-pay

## 2023-01-04 LAB — CBC
HCT: 29 % — ABNORMAL LOW (ref 36.0–46.0)
Hemoglobin: 8.9 g/dL — ABNORMAL LOW (ref 12.0–15.0)
MCH: 26.3 pg (ref 26.0–34.0)
MCHC: 30.7 g/dL (ref 30.0–36.0)
MCV: 85.5 fL (ref 80.0–100.0)
Platelets: 258 10*3/uL (ref 150–400)
RBC: 3.39 MIL/uL — ABNORMAL LOW (ref 3.87–5.11)
RDW: 14 % (ref 11.5–15.5)
WBC: 7.8 10*3/uL (ref 4.0–10.5)
nRBC: 0 % (ref 0.0–0.2)

## 2023-01-04 LAB — BASIC METABOLIC PANEL
Anion gap: 7 (ref 5–15)
BUN: 8 mg/dL (ref 8–23)
CO2: 26 mmol/L (ref 22–32)
Calcium: 8.2 mg/dL — ABNORMAL LOW (ref 8.9–10.3)
Chloride: 100 mmol/L (ref 98–111)
Creatinine, Ser: 0.92 mg/dL (ref 0.44–1.00)
GFR, Estimated: >60 mL/min (ref >60)
Glucose, Bld: 109 mg/dL — ABNORMAL HIGH (ref 70–99)
Potassium: 4 mmol/L (ref 3.5–5.1)
Sodium: 133 mmol/L — ABNORMAL LOW (ref 135–145)

## 2023-01-04 MED ORDER — HYDROCODONE-ACETAMINOPHEN 5-325 MG PO TABS
1.0000 | ORAL_TABLET | ORAL | 0 refills | Status: AC | PRN
  Filled 2023-01-04: qty 30, 3d supply, fill #0

## 2023-01-04 MED ORDER — METHOCARBAMOL 500 MG PO TABS
500.0000 mg | ORAL_TABLET | Freq: Three times a day (TID) | ORAL | 0 refills | Status: AC | PRN
  Filled 2023-01-04: qty 60, 20d supply, fill #0

## 2023-01-04 NOTE — Progress Notes (Signed)
Pt transferred from bed to [facility] with assist. O2 level stayed at 93 during transfer to and from [facility]
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
## 2023-01-04 NOTE — TOC CM/SW Note (Signed)
Patient confined to one room with no bathroom and cannot make it to a bathroom in a timely manner, therefore needs a bedside commode.
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
## 2023-01-04 NOTE — Progress Notes (Signed)
called Ortho Tech with new orders [phone] to come and assess pt for brace

## 2023-01-04 NOTE — Plan of Care (Signed)
  Problem: Education: Goal: Knowledge of General Education information will improve Description Including pain rating scale, medication(s)/side effects and non-pharmacologic comfort measures Outcome: Progressing   Problem: Health Behavior/Discharge Planning: Goal: Ability to manage health-related needs will improve Outcome: Progressing   

## 2023-01-04 NOTE — Progress Notes (Signed)
Mobility Specialist Progress Note   01/04/23 1005  Mobility  Activity Ambulated with assistance in hallway;Dangled on edge of bed;Transferred from bed to chair  Level of Assistance Standby assist, set-up cues, supervision of patient - no hands on  Assistive Device Four wheel walker  Distance Ambulated (ft) 240 ft  Range of Motion/Exercises Active;All extremities  Activity Response Tolerated well   Pre Ambulation:  HR 81, BP 174/71,  SpO2 92% RA During Ambulation: HR 90, SpO2 91-96% RA Post Ambulation: HR 84, SpO2 96% RA  Patient received in supine and agreeable to participate. Prior to ambulation, completed education with good carryover into session on precautions, donn/doffing lumbar corset, breathing techniques to manage pain, and IS/flutter valve usage. Pt needed verbal cueing for logroll technique to EOB and min A to transition from side-lying to sitting. Upon dangling EOB, pt complained of dizziness that subsided quickly. All VSS. Stood and ambulated min guard to supervision level with slow steady gait. Tolerated ambulating on room air, SpO2 maintained >91% with no c/o SOB. Returned to room without complaint or incident. Was left in recliner with all needs met, call bell in reach.   [name], BS EXP Mobility Specialist Please contact via SecureChat or Rehab office at [phone]

## 2023-01-07 DIAGNOSIS — S32018D Other fracture of first lumbar vertebra, subsequent encounter for fracture with routine healing: Secondary | ICD-10-CM | POA: Diagnosis not present

## 2023-01-07 DIAGNOSIS — M48061 Spinal stenosis, lumbar region without neurogenic claudication: Secondary | ICD-10-CM | POA: Diagnosis not present

## 2023-01-07 DIAGNOSIS — S2242XD Multiple fractures of ribs, left side, subsequent encounter for fracture with routine healing: Secondary | ICD-10-CM | POA: Diagnosis not present

## 2023-01-07 DIAGNOSIS — F4321 Adjustment disorder with depressed mood: Secondary | ICD-10-CM | POA: Diagnosis not present

## 2023-01-07 DIAGNOSIS — E785 Hyperlipidemia, unspecified: Secondary | ICD-10-CM | POA: Diagnosis not present

## 2023-01-07 DIAGNOSIS — Z79891 Long term (current) use of opiate analgesic: Secondary | ICD-10-CM | POA: Diagnosis not present

## 2023-01-07 DIAGNOSIS — G4700 Insomnia, unspecified: Secondary | ICD-10-CM | POA: Diagnosis not present

## 2023-01-07 DIAGNOSIS — K449 Diaphragmatic hernia without obstruction or gangrene: Secondary | ICD-10-CM | POA: Diagnosis not present

## 2023-01-07 DIAGNOSIS — G43909 Migraine, unspecified, not intractable, without status migrainosus: Secondary | ICD-10-CM | POA: Diagnosis not present

## 2023-01-07 DIAGNOSIS — E039 Hypothyroidism, unspecified: Secondary | ICD-10-CM | POA: Diagnosis not present

## 2023-01-07 DIAGNOSIS — J45909 Unspecified asthma, uncomplicated: Secondary | ICD-10-CM | POA: Diagnosis not present

## 2023-01-07 DIAGNOSIS — I10 Essential (primary) hypertension: Secondary | ICD-10-CM | POA: Diagnosis not present

## 2023-01-07 DIAGNOSIS — Z9181 History of falling: Secondary | ICD-10-CM | POA: Diagnosis not present

## 2023-02-23 ENCOUNTER — Other Ambulatory Visit: Payer: Self-pay | Admitting: Specialist

## 2023-02-23 DIAGNOSIS — M5459 Other low back pain: Secondary | ICD-10-CM

## 2023-02-23 DIAGNOSIS — M5451 Vertebrogenic low back pain: Secondary | ICD-10-CM | POA: Diagnosis not present

## 2023-03-03 ENCOUNTER — Inpatient Hospital Stay: Admission: RE | Admit: 2023-03-03 | Payer: Medicare Other | Source: Ambulatory Visit

## 2023-03-13 ENCOUNTER — Ambulatory Visit
Admission: RE | Admit: 2023-03-13 | Discharge: 2023-03-13 | Disposition: A | Discharge status: Home / Self Care | Payer: Medicare Other | Source: Ambulatory Visit | Attending: Specialist | Admitting: Specialist

## 2023-03-13 DIAGNOSIS — M5459 Other low back pain: Secondary | ICD-10-CM

## 2023-03-13 DIAGNOSIS — M5442 Lumbago with sciatica, left side: Secondary | ICD-10-CM | POA: Diagnosis not present

## 2023-03-25 DIAGNOSIS — M546 Pain in thoracic spine: Secondary | ICD-10-CM | POA: Diagnosis not present

## 2023-03-25 DIAGNOSIS — M5451 Vertebrogenic low back pain: Secondary | ICD-10-CM | POA: Diagnosis not present

## 2023-04-13 DIAGNOSIS — M5451 Vertebrogenic low back pain: Secondary | ICD-10-CM | POA: Diagnosis not present

## 2023-05-04 DIAGNOSIS — Z6829 Body mass index (BMI) 29.0-29.9, adult: Secondary | ICD-10-CM | POA: Diagnosis not present

## 2023-05-04 DIAGNOSIS — E559 Vitamin D deficiency, unspecified: Secondary | ICD-10-CM | POA: Diagnosis not present

## 2023-05-04 DIAGNOSIS — Z23 Encounter for immunization: Secondary | ICD-10-CM | POA: Diagnosis not present

## 2023-05-04 DIAGNOSIS — G4700 Insomnia, unspecified: Secondary | ICD-10-CM | POA: Diagnosis not present

## 2023-05-13 DIAGNOSIS — M546 Pain in thoracic spine: Secondary | ICD-10-CM | POA: Diagnosis not present

## 2023-05-13 DIAGNOSIS — M5416 Radiculopathy, lumbar region: Secondary | ICD-10-CM | POA: Diagnosis not present

## 2023-05-13 DIAGNOSIS — M5451 Vertebrogenic low back pain: Secondary | ICD-10-CM | POA: Diagnosis not present

## 2023-05-18 DIAGNOSIS — Z23 Encounter for immunization: Secondary | ICD-10-CM | POA: Diagnosis not present

## 2023-05-20 ENCOUNTER — Other Ambulatory Visit (HOSPITAL_COMMUNITY): Payer: Self-pay

## 2023-05-23 DIAGNOSIS — M546 Pain in thoracic spine: Secondary | ICD-10-CM | POA: Diagnosis not present

## 2023-06-07 DIAGNOSIS — M546 Pain in thoracic spine: Secondary | ICD-10-CM | POA: Diagnosis not present

## 2023-06-09 DIAGNOSIS — M546 Pain in thoracic spine: Secondary | ICD-10-CM | POA: Diagnosis not present

## 2023-07-14 DIAGNOSIS — M5416 Radiculopathy, lumbar region: Secondary | ICD-10-CM | POA: Diagnosis not present

## 2023-07-14 DIAGNOSIS — M546 Pain in thoracic spine: Secondary | ICD-10-CM | POA: Diagnosis not present

## 2023-07-28 DIAGNOSIS — H169 Unspecified keratitis: Secondary | ICD-10-CM | POA: Diagnosis not present

## 2023-07-28 DIAGNOSIS — H16223 Keratoconjunctivitis sicca, not specified as Sjogren's, bilateral: Secondary | ICD-10-CM | POA: Diagnosis not present

## 2023-07-28 DIAGNOSIS — H04123 Dry eye syndrome of bilateral lacrimal glands: Secondary | ICD-10-CM | POA: Diagnosis not present

## 2023-07-28 DIAGNOSIS — H40013 Open angle with borderline findings, low risk, bilateral: Secondary | ICD-10-CM | POA: Diagnosis not present

## 2023-08-05 DIAGNOSIS — M546 Pain in thoracic spine: Secondary | ICD-10-CM | POA: Diagnosis not present

## 2023-08-10 ENCOUNTER — Encounter (INDEPENDENT_AMBULATORY_CARE_PROVIDER_SITE_OTHER): Payer: Medicare Other | Admitting: Ophthalmology

## 2023-08-10 DIAGNOSIS — H35342 Macular cyst, hole, or pseudohole, left eye: Secondary | ICD-10-CM | POA: Diagnosis not present

## 2023-08-10 DIAGNOSIS — H35371 Puckering of macula, right eye: Secondary | ICD-10-CM | POA: Diagnosis not present

## 2023-08-10 DIAGNOSIS — I10 Essential (primary) hypertension: Secondary | ICD-10-CM

## 2023-08-10 DIAGNOSIS — H35033 Hypertensive retinopathy, bilateral: Secondary | ICD-10-CM | POA: Diagnosis not present

## 2023-08-10 DIAGNOSIS — H43813 Vitreous degeneration, bilateral: Secondary | ICD-10-CM | POA: Diagnosis not present

## 2023-08-19 DIAGNOSIS — M5414 Radiculopathy, thoracic region: Secondary | ICD-10-CM | POA: Diagnosis not present

## 2023-08-19 DIAGNOSIS — M5416 Radiculopathy, lumbar region: Secondary | ICD-10-CM | POA: Diagnosis not present

## 2023-09-07 DIAGNOSIS — M5414 Radiculopathy, thoracic region: Secondary | ICD-10-CM | POA: Diagnosis not present

## 2023-09-08 DIAGNOSIS — H401131 Primary open-angle glaucoma, bilateral, mild stage: Secondary | ICD-10-CM | POA: Diagnosis not present

## 2023-09-08 DIAGNOSIS — H16223 Keratoconjunctivitis sicca, not specified as Sjogren's, bilateral: Secondary | ICD-10-CM | POA: Diagnosis not present

## 2023-09-08 DIAGNOSIS — H169 Unspecified keratitis: Secondary | ICD-10-CM | POA: Diagnosis not present

## 2023-09-08 DIAGNOSIS — H04123 Dry eye syndrome of bilateral lacrimal glands: Secondary | ICD-10-CM | POA: Diagnosis not present

## 2023-09-19 DIAGNOSIS — M5416 Radiculopathy, lumbar region: Secondary | ICD-10-CM | POA: Diagnosis not present

## 2023-09-19 DIAGNOSIS — M533 Sacrococcygeal disorders, not elsewhere classified: Secondary | ICD-10-CM | POA: Diagnosis not present

## 2023-09-19 DIAGNOSIS — M5414 Radiculopathy, thoracic region: Secondary | ICD-10-CM | POA: Diagnosis not present

## 2023-09-19 DIAGNOSIS — M545 Low back pain, unspecified: Secondary | ICD-10-CM | POA: Diagnosis not present

## 2023-09-19 DIAGNOSIS — M546 Pain in thoracic spine: Secondary | ICD-10-CM | POA: Diagnosis not present

## 2023-09-22 DIAGNOSIS — M533 Sacrococcygeal disorders, not elsewhere classified: Secondary | ICD-10-CM | POA: Diagnosis not present

## 2023-10-06 DIAGNOSIS — M5416 Radiculopathy, lumbar region: Secondary | ICD-10-CM | POA: Diagnosis not present

## 2023-10-06 DIAGNOSIS — M533 Sacrococcygeal disorders, not elsewhere classified: Secondary | ICD-10-CM | POA: Diagnosis not present

## 2023-10-06 DIAGNOSIS — M545 Low back pain, unspecified: Secondary | ICD-10-CM | POA: Diagnosis not present

## 2023-10-06 DIAGNOSIS — M7918 Myalgia, other site: Secondary | ICD-10-CM | POA: Diagnosis not present

## 2023-12-14 DIAGNOSIS — H16223 Keratoconjunctivitis sicca, not specified as Sjogren's, bilateral: Secondary | ICD-10-CM | POA: Diagnosis not present

## 2023-12-14 DIAGNOSIS — H04123 Dry eye syndrome of bilateral lacrimal glands: Secondary | ICD-10-CM | POA: Diagnosis not present

## 2023-12-14 DIAGNOSIS — H401131 Primary open-angle glaucoma, bilateral, mild stage: Secondary | ICD-10-CM | POA: Diagnosis not present

## 2024-01-09 DIAGNOSIS — M5416 Radiculopathy, lumbar region: Secondary | ICD-10-CM | POA: Diagnosis not present

## 2024-01-09 DIAGNOSIS — M533 Sacrococcygeal disorders, not elsewhere classified: Secondary | ICD-10-CM | POA: Diagnosis not present

## 2024-01-09 DIAGNOSIS — M7918 Myalgia, other site: Secondary | ICD-10-CM | POA: Diagnosis not present

## 2024-03-27 DIAGNOSIS — M79652 Pain in left thigh: Secondary | ICD-10-CM | POA: Diagnosis not present

## 2024-03-27 DIAGNOSIS — M7989 Other specified soft tissue disorders: Secondary | ICD-10-CM | POA: Diagnosis not present

## 2024-03-27 DIAGNOSIS — M79662 Pain in left lower leg: Secondary | ICD-10-CM | POA: Diagnosis not present

## 2024-03-27 DIAGNOSIS — S8012XA Contusion of left lower leg, initial encounter: Secondary | ICD-10-CM | POA: Diagnosis not present

## 2024-03-27 DIAGNOSIS — M25561 Pain in right knee: Secondary | ICD-10-CM | POA: Diagnosis not present

## 2024-03-27 DIAGNOSIS — S8011XA Contusion of right lower leg, initial encounter: Secondary | ICD-10-CM | POA: Diagnosis not present

## 2024-04-03 ENCOUNTER — Encounter: Payer: Self-pay | Admitting: Neurology

## 2024-05-02 ENCOUNTER — Ambulatory Visit: Admitting: Neurology

## 2024-05-02 ENCOUNTER — Encounter: Payer: Self-pay | Admitting: Neurology

## 2024-05-02 ENCOUNTER — Other Ambulatory Visit

## 2024-05-02 VITALS — BP 149/66 | HR 84 | Ht 60.0 in | Wt 156.0 lb

## 2024-05-02 DIAGNOSIS — R2681 Unsteadiness on feet: Secondary | ICD-10-CM

## 2024-05-02 DIAGNOSIS — R202 Paresthesia of skin: Secondary | ICD-10-CM | POA: Diagnosis not present

## 2024-05-02 NOTE — Patient Instructions (Addendum)
 Check labs  Referral to start physical therapy  ELECTROMYOGRAM AND NERVE CONDUCTION STUDIES (EMG/NCS) INSTRUCTIONS  How to Prepare The neurologist conducting the EMG will need to know if you have certain medical conditions. Tell the neurologist and other EMG lab personnel if you: Have a pacemaker or any other electrical medical device Take blood-thinning medications Have hemophilia, a blood-clotting disorder that causes prolonged bleeding Bathing Take a shower or bath shortly before your exam in order to remove oils from your skin. Don't apply lotions or creams before the exam.  What to Expect You'll likely be asked to change into a hospital gown for the procedure and lie down on an examination table. The following explanations can help you understand what will happen during the exam.  Electrodes. The neurologist or a technician places surface electrodes at various locations on your skin depending on where you're experiencing symptoms. Or the neurologist may insert needle electrodes at different sites depending on your symptoms.  Sensations. The electrodes will at times transmit a tiny electrical current that you may feel as a twinge or spasm. The needle electrode may cause discomfort or pain that usually ends shortly after the needle is removed. If you are concerned about discomfort or pain, you may want to talk to the neurologist about taking a short break during the exam.  Instructions. During the needle EMG, the neurologist will assess whether there is any spontaneous electrical activity when the muscle is at rest - activity that isn't present in healthy muscle tissue - and the degree of activity when you slightly contract the muscle.  He or she will give you instructions on resting and contracting a muscle at appropriate times. Depending on what muscles and nerves the neurologist is examining, he or she may ask you to change positions during the exam.  After your EMG You may experience some  temporary, minor bruising where the needle electrode was inserted into your muscle. This bruising should fade within several days. If it persists, contact your primary care doctor.

## 2024-05-02 NOTE — Progress Notes (Signed)
 [name]   Date: 05/02/2024   [name] MRN: [id number] DOB: 1945/12/29   Dear Dr. Fernand:  Thank you for your kind referral of [name] for consultation of numbness/tingling. Although her history is well known to you, please allow us  to reiterate it for the purpose of our medical record. The patient was accompanied to the clinic by husband who also provides collateral information.     [name] is a 78 y.o. right-handed female with s/p L3-4 lumbar microdiskectomy (2023), hyperlipidemia, hypothyroidism, hypertension, and GERD presenting for evaluation of numbness/tingling.   IMPRESSION/PLAN: Bilateral feet > hand paresthesias.  Symptoms may be early signs of neuropathy.  No neuropathy risk factors such as diabetes, alcohol , or chemotherapy.  Exam shows hyperesthesia to vibration at the toes.   I had extensive discussion with the patient regarding the pathogenesis, etiology, management, and natural course of neuropathy. Neuropathy tends to be slowly progressive, especially if a treatable etiology is not identified.  I would like to test for treatable causes of neuropathy. I discussed that in the vast majority of cases, despite checking for reversible causes, we are unable to find the underlying etiology and management is symptomatic.    - Check vitamin B12, folate, vitamin B1, copper, TSH, SPEP with IFE - NCS/EMG of the right arm and leg - Refer to physical therapy for balance training, ProPT in [place] - Fall precautions discussed, use a cane on uneven ground  Return to clinic in 6 months  ------------------------------------------------------------- History of present illness: Starting around 2022, she began having numbness/tingling involving the toes which occurs nightly.  She also has burning and stinging pain.  She has sensation of leather involving the soles of the feet.  She has some imbalance and walks  unassisted.  She takes gabapentin  100mg  for insomnia.  She also reports tingling in the hands which occurs 3-4 times per week.  She mild neck pain.   No history of diabetes.  Nonsmoker.  She drinks alcohol  once every 3 weeks.  She lives at home with husband.   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   Out-side paper records, electronic medical record, and images have been reviewed where available and summarized as:  MRI lumbar spine wo contrast 03/22/2024: 1. Acute/recent or unhealed T12 and L1 superior endplate fractures with approximately 40% height loss. Possible slight bony retropulsion of L1 with mild canal stenosis. Height loss is similar to CT chest abdomen pelvis from 12/30/2022. 2. Moderate foraminal stenosis on the right at L5-S1 and mild to moderate bilateral foraminal stenosis at L3-L4. Multilevel mild canal and mild foraminal stenosis is detailed above.    Past Medical History:  Diagnosis Date   Adjustment disorder with depressed mood    Allergic rhinitis    Asthma    Complication of anesthesia    Pt stated that anesthesia was not effective in previous cataract surgery. Pt stated  I could feel what they were doing; pt stated that during an Epidural i tought my legs were up but they were down   Cough 10/28/2015   NO  10/29/2015      = 28  - Spirometry 10/28/2015  wnl including fef 25-75   - Trial off asthmanex 10/28/2015 > breathing improved/ only throat clearing remained 11/19/2015  - Sinus Ct 11/17/2015 > Paranasal sinuses are clear. - CT chest 11/17/2015 > No evidence of interstitial lung disease. No findings to explain the patient's cough -  rechallenge 11/19/2015 with 1st gen h1  > then gabapentin  100 tid      COVID 2020   Dyspnea 10/29/2015   10/28/2015  Walked RA x 3 laps @ 185 ft each stopped due to  End of study, nl pace, no sob or desat    - spirometry 10/28/2015 also wnl    GERD (gastroesophageal reflux disease)    Hallux limitus of left foot 04/21/2017    Heterozygous factor V Leiden mutation    History of hiatal hernia    History of kidney stones    Hyperlipidemia    Hypertensive disorder 06/10/2020   Hypothyroidism    Ingrown nail 04/21/2017   Insomnia    LPRD (laryngopharyngeal reflux disease) 03/10/2015   Macular hole of left eye    Migraine headache    Pneumonia    x 3   Preretinal fibrosis, left 05/24/2016   Preretinal fibrosis, left eye    Shingles outbreak     Past Surgical History:  Procedure Laterality Date   APPENDECTOMY     CATARACT EXTRACTION W/ INTRAOCULAR LENS  IMPLANT, BILATERAL     CHOLECYSTECTOMY     COLONOSCOPY     CYST EXCISION     pointer finger   LUMBAR LAMINECTOMY     LUMBAR LAMINECTOMY/DECOMPRESSION MICRODISCECTOMY N/A 12/31/2021   Procedure: Microlumbar decompression Lumbar three-four;  Surgeon: Duwayne Purchase, MD;  Location: [facility] OR;  Service: Orthopedics;  Laterality: N/A;   MEDIAL PARTIAL KNEE REPLACEMENT     left   TRIGGER FINGER RELEASE     TUBAL LIGATION     UPPER GI ENDOSCOPY     VAGINAL HYSTERECTOMY       Medications:  Outpatient Encounter Medications as of 05/02/2024  Medication Sig   buPROPion  (WELLBUTRIN  SR) 150 MG 12 hr tablet Take 150 mg by mouth in the morning.   Calcium Carb-Cholecalciferol (CALTRATE 600+D3 PO) Take 1 tablet by mouth in the morning and at bedtime.   citalopram  (CELEXA ) 20 MG tablet Take 20 mg by mouth every evening.   colestipol  (COLESTID ) 1 g tablet Take 1 g by mouth 2 (two) times daily as needed (ibs symptoms).   Dextran 70-Hypromellose, PF, (ARTIFICIAL TEARS PF) 0.1-0.3 % SOLN Place 1 drop into both eyes daily as needed (For dry eyes).   gabapentin  (NEURONTIN ) 100 MG capsule Take 100 mg by mouth at bedtime. (Patient taking differently: Take 300 mg by mouth at bedtime.)   levothyroxine  (SYNTHROID , LEVOTHROID) 50 MCG tablet Take 50 mcg by mouth every evening.   LUMIGAN 0.01 % SOLN Place 1 drop into both eyes at bedtime.   Multiple Vitamin (MULTIVITAMIN WITH  MINERALS) TABS tablet Take 1 tablet by mouth in the morning.   pantoprazole  (PROTONIX ) 20 MG tablet Take 20 mg by mouth 2 (two) times daily.   traZODone (DESYREL) 50 MG tablet Take 50 mg by mouth at bedtime.   aspirin  EC 81 MG tablet Take 1 tablet (81 mg total) by mouth daily. 5 days after surgery (Patient not taking: Reported on 05/02/2024)   cycloSPORINE  (RESTASIS ) 0.05 % ophthalmic emulsion Place 1 drop into both eyes 2 (two) times daily. (Patient not taking: Reported on 05/02/2024)   docusate sodium  (COLACE) 100 MG capsule Take 1 capsule (100 mg total) by mouth 2 (two) times daily as needed for mild constipation. (Patient not taking: Reported on 05/02/2024)   HYDROcodone -acetaminophen  (NORCO/VICODIN) 5-325 MG tablet Take 1-2 tablets by mouth every 4 (four) hours as needed for moderate pain. (Patient not taking: Reported on 05/02/2024)  methocarbamol  (ROBAXIN ) 500 MG tablet Take 1 tablet (500 mg total) by mouth every 8 (eight) hours as needed for muscle spasms. (Patient not taking: Reported on 05/02/2024)   polyethylene glycol (MIRALAX  / GLYCOLAX ) 17 g packet Take 17 g by mouth daily. (Patient not taking: Reported on 05/02/2024)   No facility-administered encounter medications on file as of 05/02/2024.    Allergies:  Allergies  Allergen Reactions   Lipitor [Atorvastatin] Other (See Comments)    Leg/extremity weakness   Lopid [Gemfibrozil] Other (See Comments)    Leg/extremity weakness   Migergot [Ergotamine-Caffeine] Other (See Comments)    Unknown reaction    Morphine And Codeine Other (See Comments)    Unknown reaction.   Tramadol  Other (See Comments)    Unknown reaction   Mercury Rash    Family History: Family History  Problem Relation Age of Onset   Asthma Mother    Hyperlipidemia Mother    Hypertension Mother    Heart disease Father     Social History: Social History   Tobacco Use   Smoking status: Never   Smokeless tobacco: Never  Substance Use Topics   Alcohol  use:  No    Alcohol /week: 0.0 standard drinks of alcohol    Drug use: No   Social History   Social History Narrative   Are you right handed or left handed? Right handed   Are you currently employed ? No    What is your current occupation?   Do you live at home alone? No    Who lives with you? Husband    What type of home do you live in: 1 story or 2 story? Lives in a one story home.        Vital Signs:  BP (!) 149/66   Pulse 84   Ht 5' (1.524 m)   Wt 156 lb (70.8 kg)   SpO2 98%   BMI 30.47 kg/m   Neurological Exam: MENTAL STATUS including orientation to time, place, person, recent and remote memory, attention span and concentration, language, and fund of knowledge is normal.  Speech is not dysarthric.  CRANIAL NERVES: II:  No visual field defects.     III-IV-VI: Pupils equal round and reactive to light.  Normal conjugate, extra-ocular eye movements in all directions of gaze.  No nystagmus.  No ptosis.   V:  Normal facial sensation.    VII:  Normal facial symmetry and movements.   VIII:  Normal hearing and vestibular function.   IX-X:  Normal palatal movement.   XI:  Normal shoulder shrug and head rotation.   XII:  Normal tongue strength and range of motion, no deviation or fasciculation.  MOTOR:  No atrophy, fasciculations or abnormal movements.  She has superficial abrasion and injury to the anterior lower legs bilaterally which is healing.  No pronator drift.   Upper Extremity:  Right  Left  Deltoid  5/5   5/5   Biceps  5/5   5/5   Triceps  5/5   5/5   Wrist extensors  5/5   5/5   Wrist flexors  5/5   5/5   Finger extensors  5/5   5/5   Finger flexors  5/5   5/5   Dorsal interossei  5/5   5/5   Abductor pollicis  5/5   5/5   Tone (Ashworth scale)  0  0   Lower Extremity:  Right  Left  Hip flexors  5/5   5/5   Knee flexors  5/5   5/5   Knee extensors  5/5   5/5   Dorsiflexors  5/5   5/5   Plantarflexors  5/5   5/5   Toe extensors  5/5   5/5   Toe flexors  5/5    5/5   Tone (Ashworth scale)  0  0   MSRs:                                           Right        Left brachioradialis 2+  2+  biceps 2+  2+  triceps 2+  2+  patellar 2+  2+  ankle jerk 1+  1+  Hoffman no  no  plantar response down  down   SENSORY:  Hyperesthesia to vibration at the great toe, worse on the left.  Pin prick and temperature is intact.  Romberg's sign is present.   COORDINATION/GAIT: Normal finger-to- nose-finger.  Intact rapid alternating movements bilaterally.  Gait is mildly wide-based, stable, unassisted   Thank you for allowing me to participate in patient's care.  If I can answer any additional questions, I would be pleased to do so.    Sincerely,    Akiyah Eppolito K. Tobie, DO

## 2024-05-03 ENCOUNTER — Ambulatory Visit: Payer: Self-pay | Admitting: Neurology

## 2024-05-08 LAB — TEST AUTHORIZATION

## 2024-05-08 LAB — B12 AND FOLATE PANEL
Folate: 12.7 ng/mL
Vitamin B-12: 284 pg/mL (ref 200–1100)

## 2024-05-08 LAB — TEST AUTHORIZATION 2

## 2024-05-08 LAB — PROTEIN ELECTROPHORESIS, SERUM
Albumin ELP: 4.2 g/dL (ref 3.8–4.8)
Alpha 1: 0.3 g/dL (ref 0.2–0.3)
Alpha 2: 0.7 g/dL (ref 0.5–0.9)
Beta 2: 0.4 g/dL (ref 0.2–0.5)
Beta Globulin: 0.5 g/dL (ref 0.4–0.6)
Gamma Globulin: 0.9 g/dL (ref 0.8–1.7)
Total Protein: 7 g/dL (ref 6.1–8.1)

## 2024-05-08 LAB — COPPER, SERUM: Copper: 154 ug/dL (ref 70–175)

## 2024-05-08 LAB — T3, FREE: T3, Free: 2.9 pg/mL (ref 2.3–4.2)

## 2024-05-08 LAB — IMMUNOFIXATION ELECTROPHORESIS
IgG (Immunoglobin G), Serum: 1119 mg/dL (ref 600–1540)
IgM, Serum: 76 mg/dL (ref 50–300)
Immunoglobulin A: 166 mg/dL (ref 70–320)

## 2024-05-08 LAB — VITAMIN B1: Vitamin B1 (Thiamine): 11 nmol/L (ref 8–30)

## 2024-05-08 LAB — TSH: TSH: 4.59 m[IU]/L — ABNORMAL HIGH (ref 0.40–4.50)

## 2024-05-08 LAB — T4, FREE: Free T4: 1.1 ng/dL (ref 0.8–1.8)

## 2024-05-23 DIAGNOSIS — M654 Radial styloid tenosynovitis [de Quervain]: Secondary | ICD-10-CM | POA: Diagnosis not present

## 2024-05-23 DIAGNOSIS — M19031 Primary osteoarthritis, right wrist: Secondary | ICD-10-CM | POA: Diagnosis not present

## 2024-05-23 DIAGNOSIS — M19032 Primary osteoarthritis, left wrist: Secondary | ICD-10-CM | POA: Diagnosis not present

## 2024-05-23 DIAGNOSIS — M25531 Pain in right wrist: Secondary | ICD-10-CM | POA: Diagnosis not present

## 2024-05-23 DIAGNOSIS — M25532 Pain in left wrist: Secondary | ICD-10-CM | POA: Diagnosis not present

## 2024-07-06 ENCOUNTER — Encounter: Admitting: Neurology

## 2024-07-06 ENCOUNTER — Encounter: Payer: Self-pay | Admitting: Neurology

## 2024-07-09 ENCOUNTER — Ambulatory Visit: Admitting: Neurology

## 2024-07-12 ENCOUNTER — Encounter: Payer: Self-pay | Admitting: Neurology

## 2024-08-09 ENCOUNTER — Encounter (INDEPENDENT_AMBULATORY_CARE_PROVIDER_SITE_OTHER): Payer: Medicare Other | Admitting: Ophthalmology

## 2024-10-30 ENCOUNTER — Ambulatory Visit: Admitting: Neurology
